# Patient Record
Sex: Female | Born: 1965 | Race: White | Hispanic: No | Marital: Single | Smoking: Current every day smoker
Health system: Southern US, Community
[De-identification: ages and names within clinical notes are randomized; demographics above are authoritative.]

## PROBLEM LIST (undated history)

## (undated) DIAGNOSIS — I10 Essential (primary) hypertension: Secondary | ICD-10-CM

## (undated) DIAGNOSIS — E7800 Pure hypercholesterolemia, unspecified: Secondary | ICD-10-CM

## (undated) DIAGNOSIS — E119 Type 2 diabetes mellitus without complications: Secondary | ICD-10-CM

## (undated) HISTORY — DX: Type 2 diabetes mellitus without complications: E11.9

## (undated) HISTORY — DX: Pure hypercholesterolemia, unspecified: E78.00

## (undated) HISTORY — PX: ENDOMETRIAL ABLATION: SHX621

## (undated) HISTORY — DX: Essential (primary) hypertension: I10

## (undated) HISTORY — PX: GANGLION CYST EXCISION: SHX1691

## (undated) HISTORY — PX: FOOT SURGERY: SHX648

---

## 2015-08-16 ENCOUNTER — Telehealth: Payer: Self-pay | Admitting: *Deleted

## 2015-08-16 ENCOUNTER — Ambulatory Visit (INDEPENDENT_AMBULATORY_CARE_PROVIDER_SITE_OTHER): Payer: No Typology Code available for payment source | Admitting: Family Medicine

## 2015-08-16 ENCOUNTER — Encounter: Payer: Self-pay | Admitting: Family Medicine

## 2015-08-16 VITALS — BP 121/58 | HR 88 | Temp 98.9°F | Resp 16 | Ht 69.5 in | Wt 156.0 lb

## 2015-08-16 DIAGNOSIS — R7303 Prediabetes: Secondary | ICD-10-CM

## 2015-08-16 DIAGNOSIS — Z1239 Encounter for other screening for malignant neoplasm of breast: Secondary | ICD-10-CM

## 2015-08-16 DIAGNOSIS — I10 Essential (primary) hypertension: Secondary | ICD-10-CM | POA: Insufficient documentation

## 2015-08-16 DIAGNOSIS — K029 Dental caries, unspecified: Secondary | ICD-10-CM

## 2015-08-16 LAB — POCT URINALYSIS DIP (DEVICE)
Bilirubin Urine: NEGATIVE
Glucose, UA: NEGATIVE mg/dL
HGB URINE DIPSTICK: NEGATIVE
Ketones, ur: NEGATIVE mg/dL
Leukocytes, UA: NEGATIVE
NITRITE: NEGATIVE
PH: 5.5 (ref 5.0–8.0)
PROTEIN: NEGATIVE mg/dL
Specific Gravity, Urine: 1.025 (ref 1.005–1.030)
UROBILINOGEN UA: 1 mg/dL (ref 0.0–1.0)

## 2015-08-16 LAB — HEMOGLOBIN A1C
HEMOGLOBIN A1C: 6.9 % — AB (ref <5.7)
MEAN PLASMA GLUCOSE: 151 mg/dL

## 2015-08-16 LAB — BASIC METABOLIC PANEL WITH GFR
BUN: 16 mg/dL (ref 7–25)
CO2: 23 mmol/L (ref 20–31)
CREATININE: 0.85 mg/dL (ref 0.50–1.05)
Calcium: 9.2 mg/dL (ref 8.6–10.4)
Chloride: 102 mmol/L (ref 98–110)
GFR, Est Non African American: 80 mL/min (ref >=60)
Glucose, Bld: 125 mg/dL — ABNORMAL HIGH (ref 65–99)
Potassium: 3.9 mmol/L (ref 3.5–5.3)
SODIUM: 136 mmol/L (ref 135–146)

## 2015-08-16 MED ORDER — LISINOPRIL 10 MG PO TABS: 10.0000 mg | ORAL_TABLET | Freq: Every day | ORAL | Status: DC

## 2015-08-16 NOTE — Patient Instructions (Signed)
Sent a referral to dentist Will call with laboratory results on 08/17/2015  Will send Lis[facility] 10 [facility] Sent an [facility] for [facility]   Fat and Cholesterol Restricted Diet High levels of fat and cholesterol in your blood may lead to various health problems, such as diseases of the heart, blood vessels, gallbladder, liver, and pancreas. Fats are concentrated sources of energy that come in various forms. Certain types of fat, including saturated fat, may be harmful in excess. Cholesterol is a substance needed by your body in small amounts. Your body makes all the cholesterol it needs. Excess cholesterol comes from the food you eat. When you have high levels of cholesterol and saturated fat in your blood, health problems can develop because the excess fat and cholesterol will gather along the walls of your blood vessels, causing them to narrow. Choosing the right foods will help you control your intake of fat and cholesterol. This will help keep the levels of these substances in your blood within normal limits and reduce your risk of disease. WHAT IS MY PLAN? Your health care provider recommends that you:  Get no more than __________ % of the total calories in your daily diet from fat.  Limit your intake of saturated fat to less than ______% of your total calories each day.  Limit the amount of cholesterol in your diet to less than _________mg per day. WHAT TYPES OF FAT SHOULD I CHOOSE?  Choose healthy fats more often. Choose monounsaturated and polyunsaturated fats, such as olive and canola oil, flaxseeds, walnuts, almonds, and seeds.  Eat more omega-3 fats. Good choices include salmon, mackerel, sardines, tuna, flaxseed oil, and ground flaxseeds. Aim to eat fish at least two times a week.  Limit saturated fats. Saturated fats are primarily found in animal products, such as meats, butter, and cream. Plant sources of saturated fats include palm oil,  palm kernel oil, and coconut oil.  Avoid foods with partially hydrogenated oils in them. These contain trans fats. Examples of foods that contain trans fats are stick margarine, some tub margarines, cookies, crackers, and other baked goods. WHAT GENERAL GUIDELINES DO I NEED TO FOLLOW? These guidelines for healthy eating will help you control your intake of fat and cholesterol:  Check food labels carefully to identify foods with trans fats or high amounts of saturated fat.  Fill one half of your plate with vegetables and green salads.  Fill one fourth of your plate with whole grains. Look for the word "whole" as the first word in the ingredient list.  Fill one fourth of your plate with lean protein foods.  Limit fruit to two servings a day. Choose fruit instead of juice.  Eat more foods that contain soluble fiber. Examples of foods that contain this type of fiber are apples, broccoli, carrots, beans, peas, and barley. Aim to get 20-30 g of fiber per day.  Eat more home-cooked food and less restaurant, buffet, and fast food.  Limit or avoid alcohol.  Limit foods high in starch and sugar.  Limit fried foods.  Cook foods using methods other than frying. Baking, boiling, grilling, and broiling are all great options.  Lose weight if you are overweight. Losing just 5-10% of your initial body weight can help your overall health and prevent diseases such as diabetes and heart disease. WHAT FOODS CAN I EAT? Grains Whole grains, such as whole wheat or whole grain breads, crackers, cereals, and pasta. Unsweetened oatmeal, bulgur, barley, quinoa,  or brown rice. Corn or whole wheat flour tortillas. Vegetables Fresh or frozen vegetables (raw, steamed, roasted, or grilled). Green salads. Fruits All fresh, canned (in natural juice), or frozen fruits. Meat and Other Protein Products Ground beef (85% or leaner), grass-fed beef, or beef trimmed of fat. Skinless chicken or [place]. Ground chicken or  [place]. Pork trimmed of fat. All fish and seafood. Eggs. Dried beans, peas, or lentils. Unsalted nuts or seeds. Unsalted canned or dry beans. Dairy Low-fat dairy products, such as skim or 1% milk, 2% or reduced-fat cheeses, low-fat ricotta or cottage cheese, or plain low-fat yogurt. Fats and Oils Tub margarines without trans fats. Light or reduced-fat mayonnaise and salad dressings. Avocado. Olive, canola, sesame, or safflower oils. Natural peanut or almond butter (choose ones without added sugar and oil). The items listed above may not be a complete list of recommended foods or beverages. Contact your dietitian for more options. WHAT FOODS ARE NOT RECOMMENDED? Grains White bread. White pasta. White rice. Cornbread. Bagels, pastries, and croissants. Crackers that contain trans fat. Vegetables White potatoes. Corn. Creamed or fried vegetables. Vegetables in a cheese sauce. Fruits Dried fruits. Canned fruit in light or heavy syrup. Fruit juice. Meat and Other Protein Products Fatty cuts of meat. Ribs, chicken wings, bacon, sausage, bologna, salami, chitterlings, fatback, hot dogs, bratwurst, and packaged luncheon meats. Liver and organ meats. Dairy Whole or 2% milk, cream, half-and-half, and cream cheese. Whole milk cheeses. Whole-fat or sweetened yogurt. Full-fat cheeses. Nondairy creamers and whipped toppings. Processed cheese, cheese spreads, or cheese curds. Sweets and Desserts Corn syrup, sugars, honey, and molasses. Candy. Jam and jelly. Syrup. Sweetened cereals. Cookies, pies, cakes, donuts, muffins, and ice cream. Fats and Oils Butter, stick margarine, lard, shortening, ghee, or bacon fat. Coconut, palm kernel, or palm oils. Beverages Alcohol. Sweetened drinks (such as sodas, lemonade, and fruit drinks or punches). The items listed above may not be a complete list of foods and beverages to avoid. Contact your dietitian for more information.   This information is not intended to replace  advice given to you by your health care provider. Make sure you discuss any questions you have with your health care provider.   Document Released: 03/05/2005 Document Revised: 03/26/2014 Document Reviewed: 06/03/2013 Elsevier Interactive Patient Education [employer]

## 2015-08-16 NOTE — Telephone Encounter (Signed)
Patient verified DOB Patient is aware [facility]. Patient is also aware of staff working on obtaining daughters DEPO injection. Daughter will call in the morning to verify the availability of the DEPO. No further questions at this time.

## 2015-08-16 NOTE — Progress Notes (Signed)
Subjective:    Patient ID: Barbara SoKaren Lloyd, female    DOB: [birth year], 50 y.o.   MRN: [id number]  HPI [name], a [age] year female with a history of hypertension and prediabetes presents to establish care. She was a patient of Barbara [facility]Lisa Stephens, [facility], she has been lost to follow up due to insurance constraints.  She is not exercising and is not adherent to low salt diet.  She does not check blood pressures at home.  Patient denies chest pain, dyspnea, fatigue, irregular heart beat, orthopnea, palpitations, syncope and tachypnea.     Past Medical History  Diagnosis Date  . Hypertension    Allergies  Allergen Reactions  . Ciprofloxacin Rash   Social History   Social History  . Marital Status: Single    Spouse Name: N/A  . Number of Children: N/A  . Years of Education: N/A   Occupational History  . Not on file.   Social History Main Topics  . Smoking status: Current Every Day Smoker -- 0.50 packs/day    Types: Cigarettes  . Smokeless tobacco: Never Used  . Alcohol Use: Yes     Comment: occ  . Drug Use: No  . Sexual Activity: Not on file   Other Topics Concern  . Not on file   Social History Narrative  . No narrative on file   Immunization History  Administered Date(s) Administered  . Pneumococcal Polysaccharide-23 08/15/2013  . Tdap 08/16/2011   Review of Systems  Constitutional: Negative.   HENT: Positive for dental problem.   Eyes: Negative.   Respiratory: Negative.   Cardiovascular: Negative.   Gastrointestinal: Negative.   Endocrine: Negative.   Genitourinary: Negative.   Musculoskeletal: Negative.   Skin: Negative.   Allergic/Immunologic: Negative.   Neurological: Negative.   Hematological: Negative.   Psychiatric/Behavioral: Negative.        Objective:   Physical Exam  Constitutional: She is oriented to person, place, and time. She appears well-developed and well-nourished.  HENT:  Head: Normocephalic and  atraumatic.  Right Ear: External ear normal.  Left Ear: External ear normal.  Nose: Nose normal.  Mouth/Throat: Oropharynx is clear and moist. Abnormal dentition. Dental caries present.  Eyes: Conjunctivae and EOM are normal. Pupils are equal, round, and reactive to light.  Neck: Normal range of motion. Neck supple.  Cardiovascular: Normal rate, regular rhythm, normal heart sounds and intact distal pulses.   Pulmonary/Chest: Effort normal and breath sounds normal.  Abdominal: Soft. Bowel sounds are normal.  Musculoskeletal: Normal range of motion.  Neurological: She is alert and oriented to person, place, and time. She has normal reflexes.  Skin: Skin is warm and dry.  Psychiatric: She has a normal mood and affect. Her behavior is normal. Judgment and thought content normal.      BP 121/58 mmHg  Pulse 88  Temp(Src) 98.9 F (37.2 C) (Oral)  Resp 16  Ht 5' 9.5" (1.765 m)  Wt 156 lb (70.761 kg)  BMI 22.71 kg/m2  SpO2 100% Assessment & Plan:   1. Essential hypertension The patient is asked to make an attempt to improve diet and exercise patterns to aid in medical management of this problem. - BASIC METABOLIC PANEL WITH GFR - Urinalysis Dipstick - lisinopril (PRINIVIL,ZESTRIL) 10 MG tablet; Take 1 tablet (10 mg total) by mouth daily.  Dispense: 30 tablet; Refill: 5  2. Dental caries  - Ambulatory referral to Dentistry  3. Prediabetes  - Hemoglobin A1c  4. Breast cancer screening  -  MM Digital Screening; Future    Routine Health Maintenance:  Recommend a lowfat, low carbohydrate diet divided over 5-6 small meals, increase water intake to 6-8 glasses, and 150 minutes per week of cardiovascular exercise.   Will check immunization registry  Pap smear in 3 months (Last pap smear 5 years)    Barbara Maroon, FNP

## 2015-08-17 ENCOUNTER — Other Ambulatory Visit: Payer: Self-pay | Admitting: Family Medicine

## 2015-08-17 DIAGNOSIS — E119 Type 2 diabetes mellitus without complications: Secondary | ICD-10-CM

## 2015-08-17 MED ORDER — METFORMIN HCL 500 MG PO TABS: 500.0000 mg | ORAL_TABLET | Freq: Every day | ORAL | Status: DC

## 2015-08-26 MED FILL — ?LISINOPRIL 10 MG TABLET: 10 | 30 days supply | Qty: 30 | Fill #0

## 2015-08-26 MED FILL — ?METFORMIN HCL 500MG TABLET: 500 | 30 days supply | Qty: 30 | Fill #0

## 2015-09-28 MED FILL — ?LISINOPRIL 10 MG TABLET: 10 | 30 days supply | Qty: 30 | Fill #1

## 2015-10-03 ENCOUNTER — Ambulatory Visit
Admission: RE | Admit: 2015-10-03 | Discharge: 2015-10-03 | Disposition: A | Discharge status: Home / Self Care | Payer: No Typology Code available for payment source | Source: Ambulatory Visit | Attending: Family Medicine | Admitting: Family Medicine

## 2015-10-03 DIAGNOSIS — Z1239 Encounter for other screening for malignant neoplasm of breast: Secondary | ICD-10-CM

## 2015-10-06 ENCOUNTER — Telehealth: Payer: Self-pay | Admitting: Family Medicine

## 2015-10-06 NOTE — Telephone Encounter (Signed)
Pt needs a referral for the eye doctor and for her sleep apnea. Please advise

## 2015-10-06 NOTE — Telephone Encounter (Signed)
Barbara Lloyd, Patient was only seen by [facility] once. She has orange card. Can these referrals be placed for her, or does she need an appointment? Please advise. Thanks!

## 2015-10-31 MED FILL — ?LISINOPRIL 10 MG TABLET: 10 | 30 days supply | Qty: 30 | Fill #2

## 2015-11-25 ENCOUNTER — Other Ambulatory Visit (HOSPITAL_COMMUNITY)
Admission: RE | Admit: 2015-11-25 | Discharge: 2015-11-25 | Disposition: A | Discharge status: Home / Self Care | Payer: Medicaid Other | Source: Ambulatory Visit | Attending: Family Medicine | Admitting: Family Medicine

## 2015-11-25 ENCOUNTER — Ambulatory Visit (INDEPENDENT_AMBULATORY_CARE_PROVIDER_SITE_OTHER): Payer: No Typology Code available for payment source | Admitting: Family Medicine

## 2015-11-25 VITALS — BP 137/70 | HR 78 | Temp 98.5°F | Resp 16 | Ht 69.5 in | Wt 158.0 lb

## 2015-11-25 DIAGNOSIS — Z113 Encounter for screening for infections with a predominantly sexual mode of transmission: Secondary | ICD-10-CM | POA: Insufficient documentation

## 2015-11-25 DIAGNOSIS — Z1151 Encounter for screening for human papillomavirus (HPV): Secondary | ICD-10-CM | POA: Insufficient documentation

## 2015-11-25 DIAGNOSIS — Z01419 Encounter for gynecological examination (general) (routine) without abnormal findings: Secondary | ICD-10-CM | POA: Insufficient documentation

## 2015-11-25 DIAGNOSIS — R0683 Snoring: Secondary | ICD-10-CM

## 2015-11-25 DIAGNOSIS — I10 Essential (primary) hypertension: Secondary | ICD-10-CM

## 2015-11-25 DIAGNOSIS — L409 Psoriasis, unspecified: Secondary | ICD-10-CM

## 2015-11-25 DIAGNOSIS — L0293 Carbuncle, unspecified: Secondary | ICD-10-CM

## 2015-11-25 DIAGNOSIS — Z0000 Encounter for general adult medical examination without abnormal findings: Secondary | ICD-10-CM

## 2015-11-25 DIAGNOSIS — N760 Acute vaginitis: Secondary | ICD-10-CM | POA: Diagnosis present

## 2015-11-25 DIAGNOSIS — H9190 Unspecified hearing loss, unspecified ear: Secondary | ICD-10-CM

## 2015-11-25 MED ORDER — LISINOPRIL 10 MG PO TABS
10.0000 mg | ORAL_TABLET | Freq: Every day | ORAL | 1 refills | Status: DC
Start: 2015-11-25 — End: 2016-03-06

## 2015-11-25 MED ORDER — FLUOCINONIDE-E 0.05 % EX CREA: 1.0000 "application " | TOPICAL_CREAM | Freq: Two times a day (BID) | CUTANEOUS | 1 refills | Status: DC

## 2015-11-25 NOTE — Patient Instructions (Signed)
Have sent BP and cream to pharmacy. Will send antibiotic later today.

## 2015-11-28 MED FILL — FLUOCINONIDE-E 0.05% CREAM: 0.05 | 20 days supply | Qty: 30 | Fill #0

## 2015-11-28 MED FILL — ?LISINOPRIL 10 MG TABLET: 10 | 30 days supply | Qty: 30 | Fill #0

## 2015-11-28 NOTE — Progress Notes (Signed)
Barbara Lloyd, is a 50 y.o. female  ZOX:[id number]  [id number]  DOB - 1965/10/25  CC:  Chief Complaint  Patient presents with  . Gynecologic Exam  . Hearing Problem  . Psoriasis    wants refrerral to dermatology   . Snoring    wants sleep referral   . Medication Refill    bp med refilled . wants a cream for blotchy skin. boil in groin area.        HPI: Barbara Lloyd is a 50 y.o. female here to establish care.She has recently received orange card and is asking for several referrals. She has psoriasis and wants referral to dermatology. She snores and wishes referral for sleep study. She has hearing problems and wants referral to audiology. She has a history of hypertension and prediabetes. She is on lisinopril 10 mg for hypertension. She has been prescribed metformin but is not taking. She request a refill of flucinonide for psorasis.  Allergies  Allergen Reactions  . Ciprofloxacin Rash  . Sulfa Antibiotics Rash   Past Medical History:  Diagnosis Date  . Hypertension    Current Outpatient Prescriptions on File Prior to Visit  Medication Sig Dispense Refill  . metFORMIN (GLUCOPHAGE) 500 MG tablet Take 1 tablet (500 mg total) by mouth daily with breakfast. (Patient not taking: Reported on 11/25/2015) 30 tablet 2   No current facility-administered medications on file prior to visit.    Family History  Problem Relation Age of Onset  . Adopted: Yes   Social History   Social History  . Marital status: Single    Spouse name: N/A  . Number of children: N/A  . Years of education: N/A   Occupational History  . Not on file.   Social History Main Topics  . Smoking status: Current Every Day Smoker    Packs/day: 0.50    Types: Cigarettes  . Smokeless tobacco: Never Used  . Alcohol use Yes     Comment: occ  . Drug use: No  . Sexual activity: Not on file   Other Topics Concern  . Not on file   Social History Narrative  . No narrative on file    Review of  Systems: Constitutional: Negative Skin: Positive for psorasis and recurrent boils HENT: Positive for dental caries Eyes: Negative  Neck: Negative Respiratory: Negative Cardiovascular: Negative Gastrointestinal: Negative Genitourinary: Negative  Musculoskeletal: Negative   Neurological: Negative for Hematological: Negative  Psychiatric/Behavioral: Negative    Objective:   Vitals:   11/25/15 1034  BP: 137/70  Pulse: 78  Resp: 16  Temp: 98.5 F (36.9 C)    Physical Exam: Constitutional: Patient appears well-developed and well-nourished. No distress. HENT: Normocephalic, atraumatic, External right and left ear normal. Oropharynx is clear and moist.  Eyes: Conjunctivae and EOM are normal. PERRLA, no scleral icterus. Neck: Normal ROM. Neck supple. No lymphadenopathy, No thyromegaly. CVS: RRR, S1/S2 +, no murmurs, no gallops, no rubs Pulmonary: Effort and breath sounds normal, no stridor, rhonchi, wheezes, rales.  Abdominal: Soft. Normoactive BS,, no distension, tenderness, rebound or guarding.  Musculoskeletal: Normal range of motion. No edema and no tenderness.  Neuro: Alert.Normal muscle tone coordination. Non-focal Skin: Skin is warm and dry. No rash noted. Not diaphoretic. No erythema. No pallor. Psychiatric: Normal mood and affect. Behavior, judgment, thought content normal.  No results found for: WBC, HGB, HCT, MCV, PLT Lab Results  Component Value Date   CREATININE 0.85 08/16/2015   BUN 16 08/16/2015   NA 136 08/16/2015   K  3.9 08/16/2015   CL 102 08/16/2015   CO2 23 08/16/2015    Lab Results  Component Value Date   HGBA1C 6.9 (H) 08/16/2015   Lipid Panel  No results found for: CHOL, TRIG, HDL, CHOLHDL, VLDL, LDLCALC     Assessment and plan:   1. Essential hypertension  - lisinopril (PRINIVIL,ZESTRIL) 10 MG tablet; Take 1 tablet (10 mg total) by mouth daily.  Dispense: 90 tablet; Refill: 1  2. Well woman exam  - Cytology - PAP [place]um  3.  Psoriasis -Lidex prescribed   4. Recurrent boils -Tetracycline prescribed.   5. Snoring  - Nocturnal polysomnography; Future  6. Decreased hearing, unspecified laterality  - Ambulatory referral to Audiology   No Follow-up on file.  The patient was given clear instructions to go to ER or return to medical center if symptoms don't improve, worsen or new problems develop. The patient verbalized understanding.    Henrietta HooverLinda C Berdine Rasmusson FNP  11/28/2015, 8:09 AM

## 2015-11-29 ENCOUNTER — Other Ambulatory Visit: Payer: Self-pay | Admitting: Family Medicine

## 2015-11-29 LAB — CYTOLOGY - PAP

## 2015-11-29 MED ORDER — TETRACYCLINE HCL 250 MG PO CAPS: 250.0000 mg | ORAL_CAPSULE | Freq: Four times a day (QID) | ORAL | Status: DC

## 2015-11-30 ENCOUNTER — Other Ambulatory Visit: Payer: Self-pay | Admitting: Family Medicine

## 2015-11-30 LAB — CERVICOVAGINAL ANCILLARY ONLY: Candida vaginitis: NEGATIVE

## 2015-11-30 MED ORDER — METRONIDAZOLE 500 MG PO TABS: 500.0000 mg | ORAL_TABLET | Freq: Two times a day (BID) | ORAL | 0 refills | Status: DC

## 2015-11-30 MED ORDER — TETRACYCLINE HCL 250 MG PO CAPS: 250.0000 mg | ORAL_CAPSULE | Freq: Four times a day (QID) | ORAL | 0 refills | Status: DC

## 2015-11-30 MED FILL — TETRACYCLINE 250 MG CAPSULE: 250 | 10 days supply | Qty: 40 | Fill #0

## 2015-11-30 MED FILL — metroNIDAZOLE 500 MG TABS: 500 | 7 days supply | Qty: 14 | Fill #0

## 2015-11-30 NOTE — Progress Notes (Signed)
Patient returned call and I advised of normal pap and presence of bv and to take flagyl as prescribed until finnished and to avoid alcohol.

## 2015-12-28 MED FILL — LISINOPRIL 10 MG TABLET: 10 | 30 days supply | Qty: 30 | Fill #1

## 2016-01-03 ENCOUNTER — Ambulatory Visit: Payer: No Typology Code available for payment source | Attending: Family Medicine | Admitting: Audiology

## 2016-01-03 DIAGNOSIS — H9193 Unspecified hearing loss, bilateral: Secondary | ICD-10-CM

## 2016-01-03 DIAGNOSIS — R292 Abnormal reflex: Secondary | ICD-10-CM | POA: Insufficient documentation

## 2016-01-03 DIAGNOSIS — H93299 Other abnormal auditory perceptions, unspecified ear: Secondary | ICD-10-CM

## 2016-01-03 DIAGNOSIS — H9011 Conductive hearing loss, unilateral, right ear, with unrestricted hearing on the contralateral side: Secondary | ICD-10-CM

## 2016-01-03 DIAGNOSIS — H748X9 Other specified disorders of middle ear and mastoid, unspecified ear: Secondary | ICD-10-CM

## 2016-01-03 NOTE — Procedures (Signed)
Outpatient Audiology and [facility]  [address]  [place], [place] [address]  [phone]   Audiological Evaluation  Patient Name: Barbara Lloyd   Status: Outpatient   DOB: [birth year]    Diagnosis: Hearing Loss                MRN: [id number] Date:  01/03/2016     Referent: Concepcion LivingBERNHARDT, LINDA, NP  History: Barbara Lloyd was seen for an audiological evaluation. Accompanied by: Barbara Lloyd Primary Concern: "Struggle hearing at times with high pitched tinnitus".  Sometimes has difficulty hearing on the telephone. Hearing poorer on the right side. History of hearing problems:  N History of ear infections:   N History of ear surgery or "tubes" : N History of dizziness/vertigo:   N History of balance issues:  N Tinnitus: Y  High pitched Sound sensitivity: N History of hypertension: N History of diabetes:  N Medications: Lisinopril 10mg     Evaluation: Conventional pure tone audiometry from 250Hz  - 8000Hz  with using insert earphones.  Hearing Thresholds: Right ear:  Thresholds of *40 dBHL at 250Hz  and 15-30 dBHL from 500Hz  - 8000Hz . Conductive component.  Left ear:    Thresholds of 30 dBHL at 250Hz ; 10-20 dBHL from 500Hz  - 2000Hz  and 25-35 dBHL from 4000Hz  - 8000Hz . Mixed component.  Reliability is good Speech reception levels (repeating words near threshold) using recorded spondee word lists:  Right ear: 20 dBHL.  Left ear:  20 dBHL Word recognition (at comfortably loud volumes) using recorded NU-6 word lists at 70 dBHL, in quiet.  Right ear: 96%.  Left ear:   100% Word recognition in minimal background noise:  +5 dBHL  Right ear: 64%                               Left ear:  82%  Tympanometry (middle ear function) shows normal middle ear volume, pressure and compliance bilaterally (Type A). Ipsilateral acoustic reflexes 90 dB from 500Hz  - 2000Hz  and 95-100dB at 4000Hz  bilaterally.    Acoustic reflex decay is negative on the left side and negative on the right  side using 1000Hz  tone.    CONCLUSION:      Barbara Lloyd needs close monitoring of her hearing, especially on the right side.  A repeat hearing evaluation has been scheduled here in early November here, however, please cancel this appointment if Concepcion LivingBERNHARDT, LINDA, NP refers Barbara Lloyd to an ENT.  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  The left ear has better hearing thresholds, ranging from normal to a borderline mild hearing loss that appears to have a sensorineural component.   This amount of hearing loss would adversely affect speech communication at normal conversational speech levels.   Word recognition is excellent in each ear in quiet at conversational speech levels bilaterally. In minimal background noise, word recognition drops to poor in the right ear and is remains good in the left ear.      There is no sign of retrocochlear issues in either ear, with the slightly elevated acoustic reflexes at 4000Hz  possible with the high frequency hearing loss.   Please have BERNHARDT, LINDA, NP evaluate the conductive component on the right side to rule out sinus/allergies contributing to this and repeat the hearing test.  Barbara Lloyd state that she has "flonase at home that  she will start now until the retest here".    RECOMMENDATIONS: 1.   Monitor hearing closely with a repeat audiological evaluation on January 19, 2016 at [facility].  Please cancel this appointment if referrered to an ENT. 2.   Consider further evaluation of the tinnitus by an Ear, Nose and Throat physician for the right ear conductive component and reported tinnitus.   3. To minimize the adverse effects of tinnitus 1) avoid quiet  2) use noise maskers at home such as a sound machine, quiet music, a fan or other background noise at a volume just loud enough to mask the high pitched  tinnitus. 3) If the tinnitus becomes more bothersome, adversely affecting your sleep or concentration, contact your physician,  seek additional medical help by an ENT for further treatment of your tinnitus. 4.  Strategies that help improve hearing include: A) Face the speaker directly. Optimal is having the speakers face well - lit.  Unless amplified, being within 3-6 feet of the speaker will enhance word recognition. B) Avoid having the speaker back-lit as this will minimize the ability to use cues from lip-reading, facial expression and gestures. C)  Word recognition is poorer in background noise. For optimal word recognition, turn off the TV, radio or noisy fan when engaging in conversation. In a restaurant, try to sit away from noise sources and close to the primary speaker.  D)  Ask for topic clarification from time to time in order to remain in the conversation.  Most people don't mind repeating or clarifying a point when asked.  If needed, explain the difficulty hearing in background noise or hearing loss. 5.   Use hearing protection during noisy activities such as using a weed eater, moving the lawn, shooting, etc.    Musician's plugs, are available from [employer] for music related hearing protection because there is no distortion.  Other hearing protection, such as sponge plugs (available at pharmacies) or earmuffs (available at sporting goods stores or department stores such as [employer]) are useful for noisy activities and venues.  Kamelia Lampkins L. Kate Sable, Au.D., CCC-A Doctor of Audiology 01/03/2016

## 2016-01-04 ENCOUNTER — Ambulatory Visit: Payer: No Typology Code available for payment source | Admitting: Audiology

## 2016-01-10 ENCOUNTER — Telehealth: Payer: Self-pay

## 2016-01-11 ENCOUNTER — Other Ambulatory Visit: Payer: Self-pay | Admitting: Family Medicine

## 2016-01-12 ENCOUNTER — Other Ambulatory Visit: Payer: Self-pay | Admitting: Family Medicine

## 2016-01-12 DIAGNOSIS — H9190 Unspecified hearing loss, unspecified ear: Secondary | ICD-10-CM

## 2016-01-13 ENCOUNTER — Ambulatory Visit: Payer: No Typology Code available for payment source | Attending: Internal Medicine

## 2016-01-17 ENCOUNTER — Ambulatory Visit (HOSPITAL_BASED_OUTPATIENT_CLINIC_OR_DEPARTMENT_OTHER): Payer: Self-pay | Attending: Family Medicine | Admitting: Internal Medicine

## 2016-01-17 VITALS — Ht 69.5 in | Wt 160.0 lb

## 2016-01-17 DIAGNOSIS — R0683 Snoring: Secondary | ICD-10-CM | POA: Insufficient documentation

## 2016-01-19 ENCOUNTER — Ambulatory Visit: Payer: No Typology Code available for payment source | Admitting: Audiology

## 2016-01-20 LAB — GLUCOSE, POCT (MANUAL RESULT ENTRY): POC Glucose: 119 mg/dl — AB (ref 70–99)

## 2016-01-26 ENCOUNTER — Telehealth: Payer: Self-pay

## 2016-01-26 NOTE — Telephone Encounter (Signed)
LINDA, PLEASE ADVISE. THANKS!

## 2016-01-28 DIAGNOSIS — R0683 Snoring: Secondary | ICD-10-CM

## 2016-01-28 NOTE — Procedures (Signed)
  Patient Name: Barbara Lloyd, Sheriece Study Date: 01/17/2016 Gender: Female D.O.B: 11-08-1965 Age (years): 50 Referring Provider: Henrietta HooverLinda C Bernhardt Height (inches): 70 Interpreting Physician: Jetty Duhamellinton Waylin Dorko MD, ABSM Weight (lbs): 160 RPSGT: Wylie HailDavis, Rico BMI: 23 MRN: [id number] Neck Size: 14.25 CLINICAL INFORMATION Sleep Study Type: NPSG Indication for sleep study: Snoring Epworth Sleepiness Score: 12  SLEEP STUDY TECHNIQUE As per the AASM Manual for the Scoring of Sleep and Associated Events v2.3 (April 2016) with a hypopnea requiring 4% desaturations. The channels recorded and monitored were frontal, central and occipital EEG, electrooculogram (EOG), submentalis EMG (chin), nasal and oral airflow, thoracic and abdominal wall motion, anterior tibialis EMG, snore microphone, electrocardiogram, and pulse oximetry.  MEDICATIONS Medications self-administered by patient taken the night of the study : charted for review  SLEEP ARCHITECTURE The study was initiated at 10:26:41 PM and ended at 4:38:21 AM. Sleep onset time was 3.4 minutes and the sleep efficiency was 93.4%. The total sleep time was 347.2 minutes. Stage REM latency was 101.0 minutes. The patient spent 6.77% of the night in stage N1 sleep, 77.54% in stage N2 sleep, 2.02% in stage N3 and 13.68% in REM. Alpha intrusion was absent. Supine sleep was 2.15%.  RESPIRATORY PARAMETERS The overall apnea/hypopnea index (AHI) was 1.7 per hour. There were 2 total apneas, including 2 obstructive, 0 central and 0 mixed apneas. There were 8 hypopneas and 3 RERAs. The AHI during Stage REM sleep was 0.0 per hour. AHI while supine was 32.2 per hour. The mean oxygen saturation was 95.71%. The minimum SpO2 during sleep was 87.00%. Moderate snoring was noted during this study.  CARDIAC DATA The 2 lead EKG demonstrated sinus rhythm. The mean heart rate was 62.87 beats per minute. Other EKG findings include: None.  LEG MOVEMENT DATA The total  PLMS were 0 with a resulting PLMS index of 0.00. Associated arousal with leg movement index was 0.0 .  IMPRESSIONS - No significant obstructive sleep apnea occurred during this study (AHI = 1.7/h). - No significant central sleep apnea occurred during this study (CAI = 0.0/h). - Mild oxygen desaturation was noted during this study (Min O2 = 87.00%). - The patient snored with Moderate snoring volume. - No cardiac abnormalities were noted during this study. - Clinically significant periodic limb movements did not occur during sleep. No significant associated arousals.  DIAGNOSIS - Normal study  RECOMMENDATIONS - Positional therapy avoiding supine position during sleep. - Avoid alcohol, sedatives and other CNS depressants that may worsen sleep apnea and disrupt normal sleep architecture. - Sleep hygiene should be reviewed to assess factors that may improve sleep quality. - Weight management and regular exercise should be initiated or continued if appropriate.  [Electronically signed] 01/28/2016 11:56 AM  Jetty Duhamellinton Brentney Goldbach MD, ABSM Diplomate, American Board of Sleep Medicine   NPI: [id number]  Waymon BudgeYOUNG,Hadiyah Maricle D Diplomate, American Board of Sleep Medicine  ELECTRONICALLY SIGNED ON:  01/28/2016, 11:54 AM [facility] PH: [phone]   FX: [phone] ACCREDITED BY THE AMERICAN ACADEMY OF SLEEP MEDICINE

## 2016-01-31 MED FILL — ?LISINOPRIL 10 MG TABLET: 10 | 30 days supply | Qty: 30 | Fill #2

## 2016-02-07 ENCOUNTER — Ambulatory Visit: Payer: Medicaid Other | Attending: Internal Medicine

## 2016-02-07 ENCOUNTER — Telehealth: Payer: Self-pay

## 2016-02-08 NOTE — Telephone Encounter (Signed)
ERROR

## 2016-02-26 ENCOUNTER — Emergency Department (INDEPENDENT_AMBULATORY_CARE_PROVIDER_SITE_OTHER)
Admission: EM | Admit: 2016-02-26 | Discharge: 2016-02-26 | Disposition: A | Discharge status: Home / Self Care | Payer: No Typology Code available for payment source | Source: Home / Self Care | Attending: Family Medicine | Admitting: Family Medicine

## 2016-02-26 ENCOUNTER — Encounter: Payer: Self-pay | Admitting: Emergency Medicine

## 2016-02-26 DIAGNOSIS — J209 Acute bronchitis, unspecified: Secondary | ICD-10-CM

## 2016-02-26 DIAGNOSIS — R300 Dysuria: Secondary | ICD-10-CM

## 2016-02-26 LAB — POCT URINALYSIS DIP (MANUAL ENTRY)
Bilirubin, UA: NEGATIVE
Blood, UA: NEGATIVE
Glucose, UA: NEGATIVE
Ketones, POC UA: NEGATIVE
Leukocytes, UA: NEGATIVE
Nitrite, UA: NEGATIVE
Protein Ur, POC: NEGATIVE
Spec Grav, UA: 1.025
Urobilinogen, UA: 1
pH, UA: 5.5

## 2016-02-26 MED ORDER — IPRATROPIUM BROMIDE 0.06 % NA SOLN: 2.0000 | Freq: Four times a day (QID) | NASAL | 12 refills | Status: DC

## 2016-02-26 MED ORDER — AZITHROMYCIN 250 MG PO TABS: 250.0000 mg | ORAL_TABLET | Freq: Every day | ORAL | 0 refills | Status: DC

## 2016-02-26 MED ORDER — GUAIFENESIN-CODEINE 100-10 MG/5ML PO SOLN: 10.0000 mL | Freq: Four times a day (QID) | ORAL | 0 refills | Status: DC | PRN

## 2016-02-26 NOTE — ED Triage Notes (Signed)
Pt c/o productive cough x 1 week, when taking a deep breath it hurts across chest.  Also possible UTI, having pressure.

## 2016-02-26 NOTE — ED Provider Notes (Signed)
CSN: [id number]     Arrival date & time 02/26/16  1242 History   First MD Initiated Contact with Patient 02/26/16 1320     Chief Complaint  Patient presents with  . Cough  . Urinary Tract Infection   (Consider location/radiation/quality/duration/timing/severity/associated sxs/prior Treatment) HPI Barbara Lloyd is a 50 y.o. female presenting to UC with c/o 1 week of gradually worsening productive cough with green sputum.  Pt also reports mild chest pain with deep breathing and cough.  She has been using OTC Robitussin w/o relief.  Denies known fever but notes she has been taking Tylenol "around the clock."  Denies n/v/d. She is also c/o lower abdominal pressure with urinary frequency but denies pain or hematuria. Denies lower back pain.    Past Medical History:  Diagnosis Date  . Hypertension    Past Surgical History:  Procedure Laterality Date  . CESAREAN SECTION     x2  . ENDOMETRIAL ABLATION     2006  . FOOT SURGERY    . GANGLION CYST EXCISION     Family History  Problem Relation Age of Onset  . Adopted: Yes   Social History  Substance Use Topics  . Smoking status: Current Every Day Smoker    Packs/day: 0.50    Types: Cigarettes  . Smokeless tobacco: Never Used  . Alcohol use Yes     Comment: occ   OB History    No data available     Review of Systems  Constitutional: Negative for chills and fever.  HENT: Positive for congestion and sore throat ( mild). Negative for ear pain, trouble swallowing and voice change.   Respiratory: Positive for cough and chest tightness. Negative for shortness of breath.   Cardiovascular: Negative for chest pain and palpitations.  Gastrointestinal: Negative for abdominal pain, diarrhea, nausea and vomiting.  Musculoskeletal: Negative for arthralgias, back pain and myalgias.  Skin: Negative for rash.  Neurological: Negative for dizziness, light-headedness and headaches.    Allergies  Ciprofloxacin and Sulfa antibiotics  Home  Medications   Prior to Admission medications   Medication Sig Start Date End Date Taking? Authorizing Provider  azithromycin (ZITHROMAX) 250 MG tablet Take 1 tablet (250 mg total) by mouth daily. Take first 2 tablets together, then 1 every day until finished. 02/26/16   Barbara FinnerErin O'Malley, PA-C  guaiFENesin-codeine 100-10 MG/5ML syrup Take 10 mLs by mouth every 6 (six) hours as needed for cough. 02/26/16   Barbara FinnerErin O'Malley, PA-C  ipratropium (ATROVENT) 0.06 % nasal spray Place 2 sprays into both nostrils 4 (four) times daily. 02/26/16   Barbara FinnerErin O'Malley, PA-C  lisinopril (PRINIVIL,ZESTRIL) 10 MG tablet Take 1 tablet (10 mg total) by mouth daily. 11/25/15   Henrietta HooverLinda C Bernhardt, NP   Meds Ordered and Administered this Visit  Medications - No data to display  BP 113/72 (BP Location: Left Arm)   Pulse 81   Temp 98.5 F (36.9 C) (Oral)   Ht 5\' 9"  (1.753 m)   Wt 164 lb (74.4 kg)   SpO2 99%   BMI 24.22 kg/m  No data found.   Physical Exam  Constitutional: She appears well-developed and well-nourished. No distress.  HENT:  Head: Normocephalic and atraumatic.  Right Ear: Tympanic membrane normal.  Left Ear: Tympanic membrane normal.  Nose: Mucosal edema present. Right sinus exhibits no maxillary sinus tenderness and no frontal sinus tenderness. Left sinus exhibits no maxillary sinus tenderness and no frontal sinus tenderness.  Mouth/Throat: Uvula is midline and mucous membranes are normal. Posterior  oropharyngeal erythema present. No oropharyngeal exudate, posterior oropharyngeal edema or tonsillar abscesses.  Eyes: Conjunctivae are normal. No scleral icterus.  Neck: Normal range of motion. Neck supple.  Cardiovascular: Normal rate, regular rhythm and normal heart sounds.   Pulmonary/Chest: Effort normal. No stridor. No respiratory distress. She has wheezes (faint expiratory in lower lung fields.). She has rhonchi ( faint diffuse, clears with cough). She has no rales. She exhibits no tenderness.   Abdominal: Soft. She exhibits no distension. There is no tenderness. There is no CVA tenderness.  Musculoskeletal: Normal range of motion.  Lymphadenopathy:    She has no cervical adenopathy.  Neurological: She is alert.  Skin: Skin is warm and dry. She is not diaphoretic.  Nursing note and vitals reviewed.   Urgent Care Course   Clinical Course     Procedures (including critical care time)  Labs Review Labs Reviewed  POCT URINALYSIS DIP (MANUAL ENTRY)    Imaging Review No results found.   MDM   1. Acute bronchitis, unspecified organism    Pt c/o 1 week of worsening productive cough as well as urinary frequency with bladder pressure.   Faint diffuse wheeze and rhonchi, will cover for atypical bacteria for bronchitis.  Rx: Azithromycin and ipratropium nasal spray.  Guaifenesine-codeine for cough at night. F/u with PCP in 7-10 days if not improving.       Barbara Finnerrin O'Malley, PA-C 02/26/16 1340

## 2016-02-26 NOTE — Discharge Instructions (Signed)
°  Guaifenesine-Codeine cough medication can cause drowsiness. Do not drink alcohol or drive while taking.  This medication is good to take 30 minutes to 1 hour prior to bedtime to help prevent coughing at night.  For the Ipratropium nasal spray, be sure to use as prescribed to help prevent post-nasal drip, which can trigger coughing, especially at night.  Use 2 sprays per nostril 4 times daily while sick.  You should spray one time in each nostril pointing the spray to the outter portion of your nostril, breath in slowly while spraying. Wait about 30 seconds to 1 minute before given the second spray in each nostril.  This will ensure you get the most benefit from each spray.

## 2016-02-27 MED FILL — ?LISINOPRIL 10 MG TABLET: 10 | 30 days supply | Qty: 30 | Fill #3

## 2016-03-06 ENCOUNTER — Emergency Department (INDEPENDENT_AMBULATORY_CARE_PROVIDER_SITE_OTHER): Payer: No Typology Code available for payment source

## 2016-03-06 ENCOUNTER — Encounter: Payer: Self-pay | Admitting: *Deleted

## 2016-03-06 ENCOUNTER — Emergency Department (INDEPENDENT_AMBULATORY_CARE_PROVIDER_SITE_OTHER)
Admission: EM | Admit: 2016-03-06 | Discharge: 2016-03-06 | Disposition: A | Discharge status: Home / Self Care | Payer: No Typology Code available for payment source | Source: Home / Self Care | Attending: Family Medicine | Admitting: Family Medicine

## 2016-03-06 DIAGNOSIS — R0789 Other chest pain: Secondary | ICD-10-CM

## 2016-03-06 DIAGNOSIS — B9789 Other viral agents as the cause of diseases classified elsewhere: Secondary | ICD-10-CM

## 2016-03-06 DIAGNOSIS — R05 Cough: Secondary | ICD-10-CM

## 2016-03-06 DIAGNOSIS — I10 Essential (primary) hypertension: Secondary | ICD-10-CM

## 2016-03-06 DIAGNOSIS — J069 Acute upper respiratory infection, unspecified: Secondary | ICD-10-CM

## 2016-03-06 MED ORDER — LISINOPRIL 10 MG PO TABS: 10.0000 mg | ORAL_TABLET | Freq: Every day | ORAL | 1 refills | Status: DC

## 2016-03-06 MED ORDER — DOXYCYCLINE HYCLATE 100 MG PO CAPS: 100.0000 mg | ORAL_CAPSULE | Freq: Two times a day (BID) | ORAL | 0 refills | Status: DC

## 2016-03-06 MED ORDER — GUAIFENESIN-CODEINE 100-10 MG/5ML PO SOLN: ORAL | 0 refills | Status: DC

## 2016-03-06 NOTE — ED Triage Notes (Signed)
Pt reports that her cough got some better from her visit on 02/26/16 with Zpak, but then moved to nasal congestion and now she reports a productive cough again.

## 2016-03-06 NOTE — Discharge Instructions (Signed)
Take plain guaifenesin (1200mg extended release tabs such as Mucinex) twice daily, with plenty of water, for cough and congestion.   Get adequate rest.   °May use Afrin nasal spray (or generic oxymetazoline) twice daily for about 5 days and then discontinue.  Also recommend using saline nasal spray several times daily and saline nasal irrigation (AYR is a common brand).  Use Flonase nasal spray each morning after using Afrin nasal spray and saline nasal irrigation. °Try warm salt water gargles for sore throat.  °Stop all antihistamines for now, and other non-prescription cough/cold preparations. °Begin Doxycycline if not improving about one week or if persistent fever develops   °Follow-up with family doctor if not improving about10 days.  °

## 2016-03-06 NOTE — ED Provider Notes (Signed)
[facility]    CSN: [id number] Arrival date & time: 03/06/16  1853     History   Chief Complaint Chief Complaint  Patient presents with  . Cough    HPI Barbara Lloyd is a 50 y.o. female.   Patient had been treated for a URI on 02/26/16 with a Z-pak and improved.  She now has one week history of recurrent sore throat, sinus congestion, fatigue, and non-productive cough.  No fevers, chills, and sweats.  She has a past history of pneumonia about 4 years ago. She has a history of seasonal rhinitis. She requests refill of her blood pressure mediation (lisinopril); she notes no adverse effects from lisinopril.   The history is provided by the patient.    Past Medical History:  Diagnosis Date  . Hypertension     Patient Active Problem List   Diagnosis Date Noted  . Essential hypertension 08/16/2015  . Dental caries 08/16/2015  . Prediabetes 08/16/2015  . BP (high blood pressure) 08/16/2015    Past Surgical History:  Procedure Laterality Date  . CESAREAN SECTION     x2  . ENDOMETRIAL ABLATION     2006  . FOOT SURGERY    . GANGLION CYST EXCISION      OB History    No data available       Home Medications    Prior to Admission medications   Medication Sig Start Date End Date Taking? Authorizing Provider  doxycycline (VIBRAMYCIN) 100 MG capsule Take 1 capsule (100 mg total) by mouth 2 (two) times daily. Take with food (Rx void after 03/14/16) 03/06/16   Lattie HawStephen A Lakendria Nicastro, MD  guaiFENesin-codeine 100-10 MG/5ML syrup Take 10mL by mouth at bedtime as needed for cough.  May repeat in 4 to 6 hours. 03/06/16   Lattie HawStephen A Niurka Benecke, MD  ipratropium (ATROVENT) 0.06 % nasal spray Place 2 sprays into both nostrils 4 (four) times daily. 02/26/16   Junius FinnerErin O'Malley, PA-C  lisinopril (PRINIVIL,ZESTRIL) 10 MG tablet Take 1 tablet (10 mg total) by mouth daily. 03/06/16   Lattie HawStephen A Natahsa Marian, MD    Family History Family History  Problem Relation Age of Onset  . Adopted:  Yes    Social History Social History  Substance Use Topics  . Smoking status: Current Every Day Smoker    Packs/day: 0.50    Types: Cigarettes  . Smokeless tobacco: Never Used  . Alcohol use Yes     Comment: occ     Allergies   Ciprofloxacin and Sulfa antibiotics   Review of Systems Review of Systems  + sore throat + cough No pleuritic pain No wheezing + nasal congestion + post-nasal drainage No sinus pain/pressure No itchy/red eyes No earache No hemoptysis No SOB No fever, + chills No nausea No vomiting No abdominal pain No diarrhea No urinary symptoms No skin rash + fatigue No myalgias No headache Used OTC meds without relief    Physical Exam Triage Vital Signs ED Triage Vitals  Enc Vitals Group     BP 03/06/16 1916 147/87     Pulse Rate 03/06/16 1916 77     Resp 03/06/16 1916 16     Temp 03/06/16 1916 98.3 F (36.8 C)     Temp Source 03/06/16 1916 Oral     SpO2 03/06/16 1916 100 %     Weight 03/06/16 1917 162 lb (73.5 kg)     Height 03/06/16 1917 5\' 9"  (1.753 m)     Head Circumference --  Peak Flow --      Pain Score 03/06/16 1917 0     Pain Loc --      Pain Edu? --      Excl. in GC? --    No data found.   Updated Vital Signs BP 147/87 (BP Location: Left Arm)   Pulse 77   Temp 98.3 F (36.8 C) (Oral)   Resp 16   Ht 5\' 9"  (1.753 m)   Wt 162 lb (73.5 kg)   SpO2 100%   BMI 23.92 kg/m   Visual Acuity Right Eye Distance:   Left Eye Distance:   Bilateral Distance:    Right Eye Near:   Left Eye Near:    Bilateral Near:     Physical Exam Nursing notes and Vital Signs reviewed. Appearance:  Patient appears stated age, and in no acute distress Eyes:  Pupils are equal, round, and reactive to light and accomodation.  Extraocular movement is intact.  Conjunctivae are not inflamed  Ears:  Canals normal.  Tympanic membranes normal.  Nose:  Mildly congested turbinates.  No sinus tenderness.    Pharynx:  Normal Neck:  Supple.   Tender enlarged posterior/lateral nodes are palpated bilaterally  Lungs:  Clear to auscultation.  Breath sounds are equal.  Moving air well. Heart:  Regular rate and rhythm without murmurs, rubs, or gallops.  Abdomen:  Nontender without masses or hepatosplenomegaly.  Bowel sounds are present.  No CVA or flank tenderness.  Extremities:  No edema.  Skin:  No rash present.    UC Treatments / Results  Labs (all labs ordered are listed, but only abnormal results are displayed) Labs Reviewed - No data to display  EKG  EKG Interpretation None       Radiology Dg Chest 2 View  Result Date: 03/06/2016 CLINICAL DATA:  50 year old female with a history of chest tightness and cough EXAM: CHEST  2 VIEW COMPARISON:  None. FINDINGS: Cardiomediastinal silhouette within normal limits. No confluent airspace disease. No pneumothorax or pleural effusion. No displaced fracture. IMPRESSION: No radiographic evidence of acute cardiopulmonary disease. Signed, Yvone NeuJaime S. Loreta AveWagner, DO [facility] Electronically Signed   By: Gilmer MorJaime  Wagner D.O.   On: 03/06/2016 20:12    Procedures Procedures (including critical care time)  Medications Ordered in UC Medications - No data to display   Initial Impression / Assessment and Plan / UC Course  I have reviewed the triage vital signs and the nursing notes.  Pertinent labs & imaging results that were available during my care of the patient were reviewed by me and considered in my medical decision making (see chart for details).  Clinical Course   Suspect that patient has acquired a new viral URI after previous illness that responded to azithromycin. Rx for Robitussin AC for night time cough.  Take plain guaifenesin (1200mg  extended release tabs such as Mucinex) twice daily, with plenty of water, for cough and congestion.   Get adequate rest.   May use Afrin nasal spray (or generic oxymetazoline) twice daily for  about 5 days and then discontinue.  Also recommend using saline nasal spray several times daily and saline nasal irrigation (AYR is a common brand).  Use Flonase nasal spray each morning after using Afrin nasal spray and saline nasal irrigation. Try warm salt water gargles for sore throat.  Stop all antihistamines for now, and other non-prescription cough/cold preparations. Begin Doxycycline if not improving about one week or if persistent fever develops (Given  a prescription to hold, with an expiration date)  Follow-up with family doctor if not improving about10 days.   Will refill Rx for lisinopril (follow-up with PCP for management).     Final Clinical Impressions(s) / UC Diagnoses   Final diagnoses:  Viral URI with cough    New Prescriptions New Prescriptions   DOXYCYCLINE (VIBRAMYCIN) 100 MG CAPSULE    Take 1 capsule (100 mg total) by mouth 2 (two) times daily. Take with food (Rx void after 03/14/16)     Lattie Haw, MD 03/18/16 2216

## 2016-03-27 ENCOUNTER — Ambulatory Visit (INDEPENDENT_AMBULATORY_CARE_PROVIDER_SITE_OTHER): Payer: Self-pay | Admitting: Family Medicine

## 2016-03-27 ENCOUNTER — Encounter: Payer: Self-pay | Admitting: Family Medicine

## 2016-03-27 VITALS — BP 112/65 | HR 79 | Temp 98.2°F | Resp 16 | Ht 69.5 in | Wt 166.0 lb

## 2016-03-27 DIAGNOSIS — E119 Type 2 diabetes mellitus without complications: Secondary | ICD-10-CM | POA: Insufficient documentation

## 2016-03-27 DIAGNOSIS — Z1211 Encounter for screening for malignant neoplasm of colon: Secondary | ICD-10-CM

## 2016-03-27 DIAGNOSIS — I10 Essential (primary) hypertension: Secondary | ICD-10-CM

## 2016-03-27 DIAGNOSIS — R7303 Prediabetes: Secondary | ICD-10-CM

## 2016-03-27 DIAGNOSIS — Z114 Encounter for screening for human immunodeficiency virus [HIV]: Secondary | ICD-10-CM

## 2016-03-27 LAB — POCT GLYCOSYLATED HEMOGLOBIN (HGB A1C): Hemoglobin A1C: 7.1

## 2016-03-27 LAB — COMPLETE METABOLIC PANEL WITH GFR
ALT: 19 U/L (ref 6–29)
AST: 15 U/L (ref 10–35)
Albumin: 3.9 g/dL (ref 3.6–5.1)
Alkaline Phosphatase: 74 U/L (ref 33–130)
BUN: 17 mg/dL (ref 7–25)
CHLORIDE: 104 mmol/L (ref 98–110)
CO2: 25 mmol/L (ref 20–31)
Calcium: 9.2 mg/dL (ref 8.6–10.4)
Creat: 0.63 mg/dL (ref 0.50–1.05)
GFR, Est African American: >89 mL/min (ref >=60)
GFR, Est Non African American: >89 mL/min (ref >=60)
GLUCOSE: 121 mg/dL — AB (ref 65–99)
POTASSIUM: 4.3 mmol/L (ref 3.5–5.3)
SODIUM: 136 mmol/L (ref 135–146)
Total Bilirubin: 0.6 mg/dL (ref 0.2–1.2)
Total Protein: 7.2 g/dL (ref 6.1–8.1)

## 2016-03-27 LAB — CBC WITH DIFFERENTIAL/PLATELET
BASOS PCT: 1 %
Basophils Absolute: 70 cells/uL (ref 0–200)
EOS ABS: 140 {cells}/uL (ref 15–500)
Eosinophils Relative: 2 %
HCT: 39.7 % (ref 35.0–45.0)
Hemoglobin: 13.4 g/dL (ref 11.7–15.5)
LYMPHS PCT: 40 %
Lymphs Abs: 2800 cells/uL (ref 850–3900)
MCH: 29.8 pg (ref 27.0–33.0)
MCHC: 33.8 g/dL (ref 32.0–36.0)
MCV: 88.2 fL (ref 80.0–100.0)
MONOS PCT: 6 %
MPV: 10 fL (ref 7.5–12.5)
Monocytes Absolute: 420 cells/uL (ref 200–950)
NEUTROS PCT: 51 %
Neutro Abs: 3570 cells/uL (ref 1500–7800)
PLATELETS: 311 10*3/uL (ref 140–400)
RBC: 4.5 MIL/uL (ref 3.80–5.10)
RDW: 13.6 % (ref 11.0–15.0)
WBC: 7 10*3/uL (ref 3.8–10.8)

## 2016-03-27 LAB — POCT URINALYSIS DIP (DEVICE)
BILIRUBIN URINE: NEGATIVE
Glucose, UA: NEGATIVE mg/dL
Hgb urine dipstick: NEGATIVE
Ketones, ur: NEGATIVE mg/dL
NITRITE: NEGATIVE
PH: 6 (ref 5.0–8.0)
PROTEIN: NEGATIVE mg/dL
Specific Gravity, Urine: 1.025 (ref 1.005–1.030)
UROBILINOGEN UA: 0.2 mg/dL (ref 0.0–1.0)

## 2016-03-27 LAB — LIPID PANEL
CHOL/HDL RATIO: 4.8 ratio (ref <5.0)
Cholesterol: 192 mg/dL (ref <200)
HDL: 40 mg/dL — AB (ref >50)
LDL Cholesterol: 126 mg/dL — ABNORMAL HIGH (ref <100)
Triglycerides: 132 mg/dL (ref <150)
VLDL: 26 mg/dL (ref <30)

## 2016-03-27 MED ORDER — METFORMIN HCL 500 MG PO TABS: 500.0000 mg | ORAL_TABLET | Freq: Two times a day (BID) | ORAL | 1 refills | Status: DC

## 2016-03-27 MED ORDER — LISINOPRIL 10 MG PO TABS: 10.0000 mg | ORAL_TABLET | Freq: Every day | ORAL | 1 refills | Status: DC

## 2016-03-27 MED FILL — ?METFORMIN HCL 500MG TABLET: 500 | 30 days supply | Qty: 60 | Fill #0

## 2016-03-27 NOTE — Patient Instructions (Addendum)
Diabetes Mellitus and Exercise Exercising regularly is important for your overall health, especially when you have diabetes (diabetes mellitus). Exercising is not only about losing weight. It has many health benefits, such as increasing muscle strength and bone density and reducing body fat and stress. This leads to improved fitness, flexibility, and endurance, all of which result in better overall health. Exercise has additional benefits for people with diabetes, including:  Reducing appetite.  Helping to lower and control blood glucose.  Lowering blood pressure.  Helping to control amounts of fatty substances (lipids) in the blood, such as cholesterol and triglycerides.  Helping the body to respond better to insulin (improving insulin sensitivity).  Reducing how much insulin the body needs.  Decreasing the risk for heart disease by:  Lowering cholesterol and triglyceride levels.  Increasing the levels of good cholesterol.  Lowering blood glucose levels. What is my activity plan? Your health care provider or certified diabetes educator can help you make a plan for the type and frequency of exercise (activity plan) that works for you. Make sure that you:  Do at least 150 minutes of moderate-intensity or vigorous-intensity exercise each week. This could be brisk walking, biking, or water aerobics.  Do stretching and strength exercises, such as yoga or weightlifting, at least 2 times a week.  Spread out your activity over at least 3 days of the week.  Get some form of physical activity every day.  Do not go more than 2 days in a row without some kind of physical activity.  Avoid being inactive for more than 90 minutes at a time. Take frequent breaks to walk or stretch.  Choose a type of exercise or activity that you enjoy, and set realistic goals.  Start slowly, and gradually increase the intensity of your exercise over time. What do I need to know about managing my  diabetes?  Check your blood glucose before and after exercising.  If your blood glucose is higher than 240 mg/dL (13.3 mmol/L) before you exercise, check your urine for ketones. If you have ketones in your urine, do not exercise until your blood glucose returns to normal.  Know the symptoms of low blood glucose (hypoglycemia) and how to treat it. Your risk for hypoglycemia increases during and after exercise. Common symptoms of hypoglycemia can include:  Hunger.  Anxiety.  Sweating and feeling clammy.  Confusion.  Dizziness or feeling light-headed.  Increased heart rate or palpitations.  Blurry vision.  Tingling or numbness around the mouth, lips, or tongue.  Tremors or shakes.  Irritability.  Keep a rapid-acting carbohydrate snack available before, during, and after exercise to help prevent or treat hypoglycemia.  Avoid injecting insulin into areas of the body that are going to be exercised. For example, avoid injecting insulin into:  The arms, when playing tennis.  The legs, when jogging.  Keep records of your exercise habits. Doing this can help you and your health care provider adjust your diabetes management plan as needed. Write down:  Food that you eat before and after you exercise.  Blood glucose levels before and after you exercise.  The type and amount of exercise you have done.  When your insulin is expected to peak, if you use insulin. Avoid exercising at times when your insulin is peaking.  When you start a new exercise or activity, work with your health care provider to make sure the activity is safe for you, and to adjust your insulin, medicines, or food intake as needed.  Drink plenty   of water while you exercise to prevent dehydration or heat stroke. Drink enough fluid to keep your urine clear or pale yellow. This information is not intended to replace advice given to you by your health care provider. Make sure you discuss any questions you have with  your health care provider. Document Released: 05/26/2003 Document Revised: 09/23/2015 Document Reviewed: 08/15/2015 Elsevier Interactive Patient Education  2017 [place]. Diabetes Mellitus and Food It is important for you to manage your blood sugar (glucose) level. Your blood glucose level can be greatly affected by what you eat. Eating healthier foods in the appropriate amounts throughout the day at about the same time each day will help you control your blood glucose level. It can also help slow or prevent worsening of your diabetes mellitus. Healthy eating may even help you improve the level of your blood pressure and reach or maintain a healthy weight. General recommendations for healthful eating and cooking habits include:  Eating meals and snacks regularly. Avoid going long periods of time without eating to lose weight.  Eating a diet that consists mainly of plant-based foods, such as fruits, vegetables, nuts, legumes, and whole grains.  Using low-heat cooking methods, such as baking, instead of high-heat cooking methods, such as deep frying. Work with your dietitian to make sure you understand how to use the Nutrition Facts information on food labels. How can food affect me? Carbohydrates  Carbohydrates affect your blood glucose level more than any other type of food. Your dietitian will help you determine how many carbohydrates to eat at each meal and teach you how to count carbohydrates. Counting carbohydrates is important to keep your blood glucose at a healthy level, especially if you are using insulin or taking certain medicines for diabetes mellitus. Alcohol  Alcohol can cause sudden decreases in blood glucose (hypoglycemia), especially if you use insulin or take certain medicines for diabetes mellitus. Hypoglycemia can be a life-threatening condition. Symptoms of hypoglycemia (sleepiness, dizziness, and disorientation) are similar to symptoms of having too much alcohol. If your  health care provider has given you approval to drink alcohol, do so in moderation and use the following guidelines:  Women should not have more than one drink per day, and men should not have more than two drinks per day. One drink is equal to:  12 oz of beer.  5 oz of wine.  1 oz of hard liquor.  Do not drink on an empty stomach.  Keep yourself hydrated. Have water, diet soda, or unsweetened iced tea.  Regular soda, juice, and other mixers might contain a lot of carbohydrates and should be counted. What foods are not recommended? As you make food choices, it is important to remember that all foods are not the same. Some foods have fewer nutrients per serving than other foods, even though they might have the same number of calories or carbohydrates. It is difficult to get your body what it needs when you eat foods with fewer nutrients. Examples of foods that you should avoid that are high in calories and carbohydrates but low in nutrients include:  Trans fats (most processed foods list trans fats on the Nutrition Facts label).  Regular soda.  Juice.  Candy.  Sweets, such as cake, pie, doughnuts, and cookies.  Fried foods. What foods can I eat? Eat nutrient-rich foods, which will nourish your body and keep you healthy. The food you should eat also will depend on several factors, including:  The calories you need.  The medicines you  take.  Your weight.  Your blood glucose level.  Your blood pressure level.  Your cholesterol level. You should eat a variety of foods, including:  Protein.  Lean cuts of meat.  Proteins low in saturated fats, such as fish, egg whites, and beans. Avoid processed meats.  Fruits and vegetables.  Fruits and vegetables that may help control blood glucose levels, such as apples, mangoes, and yams.  Dairy products.  Choose fat-free or low-fat dairy products, such as milk, yogurt, and cheese.  Grains, bread, pasta, and rice.  Choose  whole grain products, such as multigrain bread, whole oats, and brown rice. These foods may help control blood pressure.  Fats.  Foods containing healthful fats, such as nuts, avocado, olive oil, canola oil, and fish. Does everyone with diabetes mellitus have the same meal plan? Because every person with diabetes mellitus is different, there is not one meal plan that works for everyone. It is very important that you meet with a dietitian who will help you create a meal plan that is just right for you. This information is not intended to replace advice given to you by your health care provider. Make sure you discuss any questions you have with your health care provider. Document Released: 11/30/2004 Document Revised: 08/11/2015 Document Reviewed: 01/30/2013 Elsevier Interactive Patient Education  2017 [employer]  Diabetes and Foot Care Diabetes may cause you to have problems because of poor blood supply (circulation) to your feet and legs. This may cause the skin on your feet to become thinner, break easier, and heal more slowly. Your skin may become dry, and the skin may peel and crack. You may also have nerve damage in your legs and feet causing decreased feeling in them. You may not notice minor injuries to your feet that could lead to infections or more serious problems. Taking care of your feet is one of the most important things you can do for yourself. Follow these instructions at home:  Wear shoes at all times, even in the house. Do not go barefoot. Bare feet are easily injured.  Check your feet daily for blisters, cuts, and redness. If you cannot see the bottom of your feet, use a mirror or ask someone for help.  Wash your feet with warm water (do not use hot water) and mild soap. Then pat your feet and the areas between your toes until they are completely dry. Do not soak your feet as this can dry your skin.  Apply a moisturizing lotion or petroleum jelly (that does not contain  alcohol and is unscented) to the skin on your feet and to dry, brittle toenails. Do not apply lotion between your toes.  Trim your toenails straight across. Do not dig under them or around the cuticle. File the edges of your nails with an emery board or nail file.  Do not cut corns or calluses or try to remove them with medicine.  Wear clean socks or stockings every day. Make sure they are not too tight. Do not wear knee-high stockings since they may decrease blood flow to your legs.  Wear shoes that fit properly and have enough cushioning. To break in new shoes, wear them for just a few hours a day. This prevents you from injuring your feet. Always look in your shoes before you put them on to be sure there are no objects inside.  Do not cross your legs. This may decrease the blood flow to your feet.  If you find a minor  scrape, cut, or break in the skin on your feet, keep it and the skin around it clean and dry. These areas may be cleansed with mild soap and water. Do not cleanse the area with peroxide, alcohol, or iodine.  When you remove an adhesive bandage, be sure not to damage the skin around it.  If you have a wound, look at it several times a day to make sure it is healing.  Do not use heating pads or hot water bottles. They may burn your skin. If you have lost feeling in your feet or legs, you may not know it is happening until it is too late.  Make sure your health care provider performs a complete foot exam at least annually or more often if you have foot problems. Report any cuts, sores, or bruises to your health care provider immediately. Contact a health care provider if:  You have an injury that is not healing.  You have cuts or breaks in the skin.  You have an ingrown nail.  You notice redness on your legs or feet.  You feel burning or tingling in your legs or feet.  You have pain or cramps in your legs and feet.  Your legs or feet are numb.  Your feet always feel  cold. Get help right away if:  There is increasing redness, swelling, or pain in or around a wound.  There is a red line that goes up your leg.  Pus is coming from a wound.  You develop a fever or as directed by your health care provider.  You notice a bad smell coming from an ulcer or wound. This information is not intended to replace advice given to you by your health care provider. Make sure you discuss any questions you have with your health care provider. Document Released: 03/02/2000 Document Revised: 08/11/2015 Document Reviewed: 08/12/2012 Elsevier Interactive Patient Education  2017 [employer]

## 2016-03-27 NOTE — Progress Notes (Signed)
Subjective:    Patient ID: Barbara Lloyd, female    DOB: Aug 12, 1965, 51 y.o.   MRN: [id number]  HPI  [name], a 51 year old female with a history of hypertension and diabetes mellitus type 2 presents presents for a follow up of chronic conditions and CPE. Ms. [name] has been taking Lisinipril 10 mg consistently for hypertension. She does not follow a low fat, low sodium diet. She says that she does not check blood pressure at home.  She is active, but does not follow an exercise routine. She continues to smoke 5-10 cigarettes per day. She has attempted to quit in the past without success.  Patient denies chest pain, dyspnea, fatigue, irregular heart beat, lower extremity edema, near-syncope, palpitations and tachypnea.  Cardiovascular risk factors: diabetes mellitus, female gender, sedentary lifestyle and smoking/ tobacco exposure. Patient also has a history of DMII. She was prescribed Metformin 500 mg with breakfast. She has consistently been taking medication for 1 week.  Patient denies foot ulcerations, increase appetite, nausea, paresthesia of the feet, polydipsia, visual disturbances, vomitting and weight loss.  Evaluation to date has been included: hemoglobin A1C.    Patient also here for annual exam. The patient has minimal complaints today. The patient is sexually active. She had a pap smear in September 2017. The patient wears seatbelts. Mammogram:   Mammogram:  last mammogram: was normal  The patient does not do regular exercise. The patient has never been transfused or tattooed.   Past Medical History:  Diagnosis Date  . Hypertension    Social History   Social History  . Marital status: Single    Spouse name: N/A  . Number of children: N/A  . Years of education: N/A   Occupational History  . Not on file.   Social History Main Topics  . Smoking status: Current Every Day Smoker    Packs/day: 0.50    Types: Cigarettes  . Smokeless tobacco: Never Used  . Alcohol  use Yes     Comment: occ  . Drug use: No  . Sexual activity: Not on file   Other Topics Concern  . Not on file   Social History Narrative  . No narrative on file   Immunization History  Administered Date(s) Administered  . Pneumococcal Polysaccharide-23 08/15/2013  . Tdap 08/16/2011     Review of Systems  Constitutional: Positive for fatigue. Negative for unexpected weight change.  HENT: Negative.   Eyes: Negative.  Negative for photophobia and visual disturbance.  Respiratory: Positive for cough.   Cardiovascular: Negative.  Negative for chest pain, palpitations and leg swelling.  Endocrine: Positive for polyuria. Negative for polydipsia.  Genitourinary: Negative.   Musculoskeletal: Negative.  Negative for myalgias.  Skin: Negative.   Allergic/Immunologic: Positive for environmental allergies and immunocompromised state.  Neurological: Negative.   Hematological: Negative.   Psychiatric/Behavioral: Negative.         Objective:   Physical Exam  Constitutional: She is oriented to person, place, and time. She appears well-developed and well-nourished.  HENT:  Head: Normocephalic and atraumatic.  Right Ear: External ear normal.  Left Ear: External ear normal.  Nose: Nose normal.  Mouth/Throat: Oropharynx is clear and moist.  Eyes: Conjunctivae and EOM are normal. Pupils are equal, round, and reactive to light.  Neck: Normal range of motion. Neck supple.  Pulmonary/Chest: Effort normal and breath sounds normal.  Abdominal: Soft. Bowel sounds are normal.  Musculoskeletal: Normal range of motion.  Neurological: She is alert and oriented to  person, place, and time. She has normal strength and normal reflexes. She is not disoriented. She displays no atrophy and no tremor. No cranial nerve deficit or sensory deficit. She exhibits normal muscle tone. She displays a negative Romberg sign. She displays no seizure activity. Coordination and gait normal.  Reflex Scores:      Tricep  reflexes are 2+ on the right side and 2+ on the left side.      Bicep reflexes are 2+ on the right side and 2+ on the left side.      Brachioradialis reflexes are 2+ on the right side and 2+ on the left side.      Patellar reflexes are 2+ on the right side and 2+ on the left side.      Achilles reflexes are 2+ on the right side and 2+ on the left side. Monofilament exam negative  Skin: Skin is warm and dry.  Psychiatric: She has a normal mood and affect. Her behavior is normal. Judgment and thought content normal.      BP 112/65 (BP Location: Left Arm, Patient Position: Sitting, Cuff Size: Normal)   Pulse 79   Temp 98.2 F (36.8 C) (Oral)   Resp 16   Ht 5' 9.5" (1.765 m)   Wt 166 lb (75.3 kg)   SpO2 100%   BMI 24.16 kg/m  Assessment & Plan:   1. Essential hypertension Blood pressure is at goal on current medication regimen.  - lisinopril (PRINIVIL,ZESTRIL) 10 MG tablet; Take 1 tablet (10 mg total) by mouth daily.  Dispense: 90 tablet; Refill: 1 - Microalbumin/Creatinine Ratio, Urine - COMPLETE METABOLIC PANEL WITH GFR - Lipid Panel - CBC with Differential  2. Type 2 diabetes mellitus without complication, without long-term current use of insulin (HCC) Recommend a lowfat, low carbohydrate diet divided over 5-6 small meals, increase water intake to 6-8 glasses, and 150 minutes per week of cardiovascular exercise.   - HgB A1c - metFORMIN (GLUCOPHAGE) 500 MG tablet; Take 1 tablet (500 mg total) by mouth 2 (two) times daily with a meal.  Dispense: 180 tablet; Refill: 1 - Microalbumin/Creatinine Ratio, Urine - Ambulatory referral to Ophthalmology  3. Screening for colon cancer  - Ambulatory referral to Gastroenterology  4. Screening for HIV (human immunodeficiency virus) - HIV antibody (with reflex)  Preventative Maintenance:  Snellen eye test Referral for DM eye exam Referral for colonoscopy Immunizations up to date Reviewed pap smear, repeat in 3 years per  guidelines    RTC: 6 months for type 2 diabetes and hypertension Stuart Guillen M, FNP

## 2016-03-28 ENCOUNTER — Other Ambulatory Visit: Payer: Self-pay | Admitting: Family Medicine

## 2016-03-28 ENCOUNTER — Other Ambulatory Visit: Payer: Self-pay

## 2016-03-28 DIAGNOSIS — E785 Hyperlipidemia, unspecified: Secondary | ICD-10-CM | POA: Insufficient documentation

## 2016-03-28 LAB — MICROALBUMIN / CREATININE URINE RATIO
CREATININE, URINE: 160 mg/dL (ref 20–320)
MICROALB UR: 0.4 mg/dL
Microalb Creat Ratio: 3 mcg/mg creat (ref <30)

## 2016-03-28 MED ORDER — ASPIRIN EC 81 MG PO TBEC: 81.0000 mg | DELAYED_RELEASE_TABLET | Freq: Every day | ORAL | 11 refills | Status: DC

## 2016-03-28 MED ORDER — PRAVASTATIN SODIUM 20 MG PO TABS: 20.0000 mg | ORAL_TABLET | Freq: Every day | ORAL | 1 refills | Status: DC

## 2016-03-28 MED FILL — PRAVASTATIN NA 20 MG TAB: 20 | 30 days supply | Qty: 30 | Fill #0

## 2016-03-28 MED FILL — ?LISINOPRIL 10 MG TABLET: 10 | 30 days supply | Qty: 30 | Fill #4

## 2016-03-28 NOTE — Progress Notes (Unsigned)
Meds ordered this encounter  Medications  . pravastatin (PRAVACHOL) 20 MG tablet    Sig: Take 1 tablet (20 mg total) by mouth daily.    Dispense:  90 tablet    Refill:  1  . aspirin EC 81 MG tablet    Sig: Take 1 tablet (81 mg total) by mouth daily.    Dispense:  30 tablet    Refill:  11  Stetson Pelaez M, FNP

## 2016-03-28 NOTE — Telephone Encounter (Signed)
Called and informed patient of elevated LDL cholesterol level. Advised to start pravastatin and aspirin 81mg  as directed. Advised patient to start lowfat/ low cholesterol diet, drink 6 to 8 glasses of water daily, and exercise 150 minutes of cardio. Patient verbalized understanding and medication were sent into corrected pharmacy. Thanks!

## 2016-03-29 ENCOUNTER — Ambulatory Visit (INDEPENDENT_AMBULATORY_CARE_PROVIDER_SITE_OTHER): Payer: No Typology Code available for payment source | Admitting: Otolaryngology

## 2016-03-29 ENCOUNTER — Ambulatory Visit: Payer: No Typology Code available for payment source | Admitting: Osteopathic Medicine

## 2016-03-29 DIAGNOSIS — H6983 Other specified disorders of Eustachian tube, bilateral: Secondary | ICD-10-CM

## 2016-03-29 DIAGNOSIS — H9313 Tinnitus, bilateral: Secondary | ICD-10-CM

## 2016-03-29 DIAGNOSIS — H903 Sensorineural hearing loss, bilateral: Secondary | ICD-10-CM

## 2016-04-02 ENCOUNTER — Other Ambulatory Visit: Payer: Self-pay

## 2016-04-02 DIAGNOSIS — E785 Hyperlipidemia, unspecified: Secondary | ICD-10-CM

## 2016-04-02 DIAGNOSIS — I10 Essential (primary) hypertension: Secondary | ICD-10-CM

## 2016-04-02 DIAGNOSIS — E119 Type 2 diabetes mellitus without complications: Secondary | ICD-10-CM

## 2016-04-02 MED ORDER — LISINOPRIL 10 MG PO TABS: 10.0000 mg | ORAL_TABLET | Freq: Every day | ORAL | 3 refills | Status: DC

## 2016-04-02 MED ORDER — METFORMIN HCL 500 MG PO TABS: 500.0000 mg | ORAL_TABLET | Freq: Two times a day (BID) | ORAL | 3 refills | Status: DC

## 2016-04-02 MED ORDER — PRAVASTATIN SODIUM 20 MG PO TABS: 20.0000 mg | ORAL_TABLET | Freq: Every day | ORAL | 3 refills | Status: DC

## 2016-04-11 ENCOUNTER — Ambulatory Visit: Payer: No Typology Code available for payment source

## 2016-04-13 ENCOUNTER — Other Ambulatory Visit: Payer: Self-pay

## 2016-04-13 DIAGNOSIS — E785 Hyperlipidemia, unspecified: Secondary | ICD-10-CM

## 2016-04-13 DIAGNOSIS — I10 Essential (primary) hypertension: Secondary | ICD-10-CM

## 2016-04-13 DIAGNOSIS — E119 Type 2 diabetes mellitus without complications: Secondary | ICD-10-CM

## 2016-04-13 MED ORDER — LISINOPRIL 10 MG PO TABS: 10.0000 mg | ORAL_TABLET | Freq: Every day | ORAL | 5 refills | Status: DC

## 2016-04-13 MED ORDER — METFORMIN HCL 500 MG PO TABS: 500.0000 mg | ORAL_TABLET | Freq: Two times a day (BID) | ORAL | 5 refills | Status: DC

## 2016-04-13 MED ORDER — PRAVASTATIN SODIUM 20 MG PO TABS: 20.0000 mg | ORAL_TABLET | Freq: Every day | ORAL | 5 refills | Status: DC

## 2016-09-24 ENCOUNTER — Ambulatory Visit: Payer: Medicaid Other | Admitting: Family Medicine

## 2016-10-01 ENCOUNTER — Ambulatory Visit: Payer: Medicaid Other | Admitting: Family Medicine

## 2016-11-12 ENCOUNTER — Ambulatory Visit: Payer: Medicaid Other | Admitting: Family Medicine

## 2017-04-19 ENCOUNTER — Emergency Department
Admission: EM | Admit: 2017-04-19 | Discharge: 2017-04-19 | Disposition: A | Discharge status: Home / Self Care | Payer: Self-pay | Source: Home / Self Care

## 2017-04-19 ENCOUNTER — Other Ambulatory Visit: Payer: Self-pay

## 2017-04-19 ENCOUNTER — Emergency Department (INDEPENDENT_AMBULATORY_CARE_PROVIDER_SITE_OTHER): Payer: Self-pay

## 2017-04-19 DIAGNOSIS — L409 Psoriasis, unspecified: Secondary | ICD-10-CM

## 2017-04-19 DIAGNOSIS — R05 Cough: Secondary | ICD-10-CM

## 2017-04-19 DIAGNOSIS — F17200 Nicotine dependence, unspecified, uncomplicated: Secondary | ICD-10-CM

## 2017-04-19 DIAGNOSIS — R053 Chronic cough: Secondary | ICD-10-CM

## 2017-04-19 LAB — POCT URINALYSIS DIP (MANUAL ENTRY)
BILIRUBIN UA: NEGATIVE mg/dL
Bilirubin, UA: NEGATIVE
Blood, UA: NEGATIVE
GLUCOSE UA: NEGATIVE mg/dL
Leukocytes, UA: NEGATIVE
Nitrite, UA: NEGATIVE
Protein Ur, POC: NEGATIVE mg/dL
Urobilinogen, UA: 0.2 E.U./dL
pH, UA: 5.5 (ref 5.0–8.0)

## 2017-04-19 MED ORDER — FLUOCINOLONE ACETONIDE SCALP 0.01 % EX OIL: TOPICAL_OIL | CUTANEOUS | 1 refills | Status: DC

## 2017-04-19 MED ORDER — AZITHROMYCIN 250 MG PO TABS: ORAL_TABLET | ORAL | 0 refills | Status: DC

## 2017-04-19 NOTE — ED Triage Notes (Signed)
Pt has had a cough for about 3 weeks now.  It gets better, then worse.  Productive with green mucous at times.  Feels like it has gone to her chest. Has had urinary frequency and burning at times, but not all the time.   Has psoriasis, and would like a cream for it.

## 2017-04-19 NOTE — ED Provider Notes (Signed)
[facility]    CSN: [id number] Arrival date & time: 04/19/17  4098     History   Chief Complaint Chief Complaint  Patient presents with  . Cough  . Urinary Frequency  . Rash    HPI Barbara Lloyd is a 52 y.o. female.   Patient presents with several complaints: 1)  Three weeks ago she developed URI symptoms with productive cough, fatigue, chills, headache, and sneezing.  She has improved but still has a productive cough.  No recent fever.  No pleuritic pain or shortness of breath.  She has a past history of pneumonia. 2)  She complains of chronic rash of psoriasis in her scalp which has responded well in the past to a steroid cream or gell. 3)  She complains of one week history of intermittent urinary frequency and urgency, without dysuria, fever, or flank pain.   The history is provided by the patient.    Past Medical History:  Diagnosis Date  . Hypertension     Patient Active Problem List   Diagnosis Date Noted  . Hyperlipidemia 03/28/2016  . Type 2 diabetes mellitus without complication, without long-term current use of insulin (HCC) 03/27/2016  . Essential hypertension 08/16/2015  . Dental caries 08/16/2015  . Prediabetes 08/16/2015  . BP (high blood pressure) 08/16/2015    Past Surgical History:  Procedure Laterality Date  . CESAREAN SECTION     x2  . ENDOMETRIAL ABLATION     2006  . FOOT SURGERY    . GANGLION CYST EXCISION      OB History    No data available       Home Medications    Prior to Admission medications   Medication Sig Start Date End Date Taking? Authorizing Provider  aspirin EC 81 MG tablet Take 1 tablet (81 mg total) by mouth daily. 03/28/16   Massie Maroon, FNP  azithromycin (ZITHROMAX Z-PAK) 250 MG tablet Take 2 tabs today; then begin one tab once daily for 4 more days. 04/19/17   Lattie Haw, MD  Fluocinolone Acetonide Scalp (DERMA-SMOOTHE/FS SCALP) 0.01 % OIL Apply to scalp at bedtime; wash off next morning.  04/19/17   Lattie Haw, MD  lisinopril (PRINIVIL,ZESTRIL) 10 MG tablet Take 1 tablet (10 mg total) by mouth daily. 04/13/16   Massie Maroon, FNP  metFORMIN (GLUCOPHAGE) 500 MG tablet Take 1 tablet (500 mg total) by mouth 2 (two) times daily with a meal. 04/13/16   Massie Maroon, FNP  pravastatin (PRAVACHOL) 20 MG tablet Take 1 tablet (20 mg total) by mouth daily. 04/13/16   Massie Maroon, FNP    Family History Family History  Adopted: Yes    Social History Social History   Tobacco Use  . Smoking status: Current Every Day Smoker    Packs/day: 0.50    Types: Cigarettes  . Smokeless tobacco: Never Used  Substance Use Topics  . Alcohol use: Yes    Comment: occ  . Drug use: No     Allergies   Ciprofloxacin and Sulfa antibiotics   Review of Systems Review of Systems No sore throat + cough No pleuritic pain No wheezing No nasal congestion No post-nasal drainage No sinus pain/pressure No itchy/red eyes No earache No hemoptysis No SOB No fever/chills No nausea No vomiting No abdominal pain No diarrhea + urinary symptoms + skin rash scalp No fatigue No myalgias No headache Used OTC meds without relief   Physical Exam Triage Vital Signs ED Triage  Vitals  Enc Vitals Group     BP 04/19/17 0959 112/75     Pulse Rate 04/19/17 0959 75     Resp --      Temp 04/19/17 0959 98 F (36.7 C)     Temp Source 04/19/17 0959 Oral     SpO2 04/19/17 0959 99 %     Weight 04/19/17 1000 158 lb (71.7 kg)     Height 04/19/17 1000 5\' 9"  (1.753 m)     Head Circumference --      Peak Flow --      Pain Score 04/19/17 1000 0     Pain Loc --      Pain Edu? --      Excl. in GC? --    No data found.  Updated Vital Signs BP 112/75 (BP Location: Right Arm)   Pulse 75   Temp 98 F (36.7 C) (Oral)   Ht 5\' 9"  (1.753 m)   Wt 158 lb (71.7 kg)   SpO2 99%   BMI 23.33 kg/m   Visual Acuity Right Eye Distance:   Left Eye Distance:   Bilateral Distance:    Right Eye  Near:   Left Eye Near:    Bilateral Near:     Physical Exam Nursing notes and Vital Signs reviewed. Appearance:  Patient appears stated age, and in no acute distress Eyes:  Pupils are equal, round, and reactive to light and accomodation.  Extraocular movement is intact.  Conjunctivae are not inflamed  Ears:  Canals normal.  Tympanic membranes normal.  Nose:  Mildly congested turbinates.  No sinus tenderness.   Pharynx:  Normal Neck:  Supple.  No adenopathy.  Lungs:  Clear to auscultation.  Breath sounds are equal.  Moving air well. Heart:  Regular rate and rhythm without murmurs, rubs, or gallops.  Abdomen:  Nontender without masses or hepatosplenomegaly.  Bowel sounds are present.  No CVA or flank tenderness.  Extremities:  No edema.  Skin:  Scalp reveals scaly hyperkeratotic plaques within hairline, especially occipital area.   UC Treatments / Results  Labs (all labs ordered are listed, but only abnormal results are displayed) Labs Reviewed  POCT URINALYSIS DIP (MANUAL ENTRY) - Abnormal; Notable for the following components:      Result Value   Spec Grav, UA >=1.030 (*)    All other components within normal limits    EKG  EKG Interpretation None       Radiology Dg Chest 2 View  Result Date: 04/19/2017 CLINICAL DATA:  Cough and congestion X 3 weeks. Pt is a current smoker. EXAM: CHEST  2 VIEW COMPARISON:  Radiograph 03/06/2016 FINDINGS: Normal mediastinum and cardiac silhouette. Normal pulmonary vasculature. No evidence of effusion, infiltrate, or pneumothorax. The prominent status no acute bony abnormality. IMPRESSION: Normal chest radiograph. Electronically Signed   By: Genevive Bi M.D.   On: 04/19/2017 10:33    Procedures Procedures (including critical care time)  Medications Ordered in UC Medications - No data to display   Initial Impression / Assessment and Plan / UC Course  I have reviewed the triage vital signs and the nursing notes.  Pertinent labs &  imaging results that were available during my care of the patient were reviewed by me and considered in my medical decision making (see chart for details).    Begin Z-pak for atypical coverage. Take plain guaifenesin (1200mg  extended release tabs such as Mucinex) twice daily, with plenty of water, for cough and congestion.  Get adequate  rest.   May take Delsym Cough Suppressant at bedtime for nighttime cough.  Stop all antihistamines for now, and other non-prescription cough/cold preparations.  Note normal urinalysis; no evidence UTI.  Rx for Derma-Smooth FS for scalp (?psoriasis vs seborrheic dermatitis)  Followup with Family Doctor if not improved in one week.     Final Clinical Impressions(s) / UC Diagnoses   Final diagnoses:  Persistent cough  Psoriasis of scalp    ED Discharge Orders        Ordered    azithromycin (ZITHROMAX Z-PAK) 250 MG tablet     04/19/17 1049    Fluocinolone Acetonide Scalp (DERMA-SMOOTHE/FS SCALP) 0.01 % OIL     04/19/17 1049            Lattie HawBeese, Tashanna Dolin A, MD 04/23/17 [phone]

## 2017-04-19 NOTE — Discharge Instructions (Signed)
Take plain guaifenesin (1200mg extended release tabs such as Mucinex) twice daily, with plenty of water, for cough and congestion.  Get adequate rest.   °May take Delsym Cough Suppressant at bedtime for nighttime cough.  °Stop all antihistamines for now, and other non-prescription cough/cold preparations. °  °  °

## 2017-04-26 ENCOUNTER — Other Ambulatory Visit: Payer: Self-pay | Admitting: Family Medicine

## 2017-04-26 ENCOUNTER — Telehealth: Payer: Self-pay

## 2017-04-26 DIAGNOSIS — I10 Essential (primary) hypertension: Secondary | ICD-10-CM

## 2017-04-26 DIAGNOSIS — E785 Hyperlipidemia, unspecified: Secondary | ICD-10-CM

## 2017-04-26 DIAGNOSIS — E119 Type 2 diabetes mellitus without complications: Secondary | ICD-10-CM

## 2017-04-26 MED ORDER — LISINOPRIL 10 MG PO TABS: 10.0000 mg | ORAL_TABLET | Freq: Every day | ORAL | 0 refills | Status: DC

## 2017-04-26 MED ORDER — PRAVASTATIN SODIUM 20 MG PO TABS: 20.0000 mg | ORAL_TABLET | Freq: Every day | ORAL | 0 refills | Status: DC

## 2017-04-26 NOTE — Telephone Encounter (Signed)
Spoke with patient, she has not been seen since 03/2016. Will give one months' worth of rx until she can come in for an office visit. Patient verbalized understanding and appointment was scheduled. [facility] [place] Thanks!

## 2017-05-20 ENCOUNTER — Ambulatory Visit (INDEPENDENT_AMBULATORY_CARE_PROVIDER_SITE_OTHER): Payer: Self-pay | Admitting: Family Medicine

## 2017-05-20 ENCOUNTER — Ambulatory Visit: Payer: Self-pay | Admitting: Family Medicine

## 2017-05-20 ENCOUNTER — Encounter: Payer: Self-pay | Admitting: Family Medicine

## 2017-05-20 VITALS — BP 112/67 | HR 60 | Temp 98.1°F | Resp 14 | Ht 70.0 in | Wt 160.0 lb

## 2017-05-20 DIAGNOSIS — E119 Type 2 diabetes mellitus without complications: Secondary | ICD-10-CM

## 2017-05-20 DIAGNOSIS — I10 Essential (primary) hypertension: Secondary | ICD-10-CM

## 2017-05-20 DIAGNOSIS — E785 Hyperlipidemia, unspecified: Secondary | ICD-10-CM

## 2017-05-20 LAB — POCT URINALYSIS DIP (DEVICE)
Bilirubin Urine: NEGATIVE
Glucose, UA: NEGATIVE mg/dL
Hgb urine dipstick: NEGATIVE
KETONES UR: NEGATIVE mg/dL
Leukocytes, UA: NEGATIVE
Nitrite: NEGATIVE
PH: 5 (ref 5.0–8.0)
PROTEIN: NEGATIVE mg/dL
Urobilinogen, UA: 0.2 mg/dL (ref 0.0–1.0)

## 2017-05-20 LAB — POCT GLYCOSYLATED HEMOGLOBIN (HGB A1C): Hemoglobin A1C: 6.2

## 2017-05-20 NOTE — Progress Notes (Signed)
Subjective:    Patient ID: Barbara Lloyd, female    DOB: 12-22-1965, 52 y.o.   MRN: [id number]  HPI  Barbara Lloyd, a 52 year old female with a history of hypertension and diabetes mellitus type 2 presents presents for a follow up of chronic conditions and CPE. Barbara Lloyd has been taking Lisinipril 10 mg consistently for hypertension. She does not follow a low fat, low sodium diet. She says that she does not check blood pressure at home.  She is active, but does not follow an exercise routine.  She has attempted to quit in the past without success.  Patient denies chest pain, dyspnea, fatigue, irregular heart beat, lower extremity edema, near-syncope, palpitations and tachypnea.  Cardiovascular risk factors: diabetes mellitus, female gender, sedentary lifestyle and smoking/ tobacco exposure. Patient also has a history of DMII. She takes Metformin 500 mg BID with meals.   Patient denies foot ulcerations, increase appetite, nausea, paresthesia of the feet, polydipsia, visual disturbances, vomitting and weight loss.  Evaluation to date has been included: hemoglobin A1C.    Past Medical History:  Diagnosis Date  . Hypertension    Social History   Socioeconomic History  . Marital status: Single    Spouse name: Not on file  . Number of children: Not on file  . Years of education: Not on file  . Highest education level: Not on file  Social Needs  . Financial resource strain: Not on file  . Food insecurity - worry: Not on file  . Food insecurity - inability: Not on file  . Transportation needs - medical: Not on file  . Transportation needs - non-medical: Not on file  Occupational History  . Not on file  Tobacco Use  . Smoking status: Current Every Day Smoker    Packs/day: 0.50    Types: Cigarettes  . Smokeless tobacco: Never Used  Substance and Sexual Activity  . Alcohol use: Yes    Comment: occ  . Drug use: No  . Sexual activity: Not on file  Other Topics Concern  . Not on  file  Social History Narrative  . Not on file   Immunization History  Administered Date(s) Administered  . Pneumococcal Polysaccharide-23 08/15/2013  . Tdap 08/16/2011     Review of Systems  Constitutional: Negative for unexpected weight change.  HENT: Negative.   Eyes: Negative.  Negative for photophobia and visual disturbance.   Cardiovascular: Negative.  Negative for chest pain, palpitations and leg swelling.  Endocrine:  Negative for polydipsia or polyphagia.  Genitourinary: Negative.   Musculoskeletal: Negative.  Negative for myalgias.  Skin: Negative.   Neurological: Negative.   Hematological: Negative.   Psychiatric/Behavioral: Negative.         Objective:   Physical Exam  Constitutional: She is oriented to person, place, and time. She appears well-developed and well-nourished.  HENT:  Head: Normocephalic and atraumatic.  Right Ear: External ear normal.  Left Ear: External ear normal.  Nose: Nose normal.  Mouth/Throat: Oropharynx is clear and moist.  Eyes: Conjunctivae and EOM are normal. Pupils are equal, round, and reactive to light.  Neck: Normal range of motion. Neck supple.  Pulmonary/Chest: Effort normal and breath sounds normal.  Abdominal: Soft. Bowel sounds are normal.  Musculoskeletal: Normal range of motion.  Neurological: She is alert and oriented to person, place, and time. She has normal strength and normal reflexes. She is not disoriented. She displays no atrophy and no tremor. No cranial nerve deficit or sensory deficit. She  exhibits normal muscle tone. She displays a negative Romberg sign. She displays no seizure activity. Coordination and gait normal.  Monofilament exam negative  Skin: Skin is warm and dry.  Psychiatric: She has a normal mood and affect. Her behavior is normal. Judgment and thought content normal.      BP 112/67 (BP Location: Right Arm, Patient Position: Sitting, Cuff Size: Normal)   Pulse 60   Temp 98.1 F (36.7 C) (Oral)    Resp 14   Ht 5\' 10"  (1.778 m)   Wt 160 lb (72.6 kg)   SpO2 100%   BMI 22.96 kg/m  Assessment & Plan:  1. Type 2 diabetes mellitus without complication, without long-term current use of insulin (HCC) Hemoglobin a1C is 6.2, which is at goal.  Will continue Metformin 500 mg BID with meals. Recommend a lowfat, low carbohydrate diet divided over 5-6 small meals, increase water intake to 6-8 glasses, and 150 minutes per week of cardiovascular exercise.    - HgB A1c - POCT urinalysis dip (device) - Lipid Panel - Basic Metabolic Panel  2. Essential hypertension Blood pressure is at goal, no medication changes warranted - Continue medication, monitor blood pressure at home. Continue DASH diet. Reminder to go to the ER if any CP, SOB, nausea, dizziness, severe HA, changes vision/speech, left arm numbness and tingling and jaw pain.    - POCT urinalysis dip (device) - Basic Metabolic Panel  3. Hyperlipidemia, unspecified hyperlipidemia type The 10-year ASCVD risk score Denman George DC Jr., et al., 2013) is: 10.1%   Values used to calculate the score:     Age: 52 years     Sex: Female     Is Non-Hispanic African American: No     Diabetic: Yes     Tobacco smoker: Yes     Systolic Blood Pressure: 112 mmHg     Is BP treated: Yes     HDL Cholesterol: 40 mg/dL     Total Cholesterol: 192 mg/dL - Lipid Panel  RTC; 6 months for chronic conditions   Nolon Nations  MSN, FNP-C Patient Care [facility] [address] [place], [place] [address] [phone]

## 2017-05-20 NOTE — Patient Instructions (Signed)
Your A1c has decreased to 6.2, which is at goal.  Will continue metformin 500 mg twice daily with meals.  Also recommend that you continue a carbohydrate modify diet and daily exercise regimen.  Increase water intake to 3-4 bottles per day.   Your blood pressure is at goal today, no medication changes warranted. - Continue medication, monitor blood pressure at home. Continue DASH diet. Reminder to go to the ER if any CP, SOB, nausea, dizziness, severe HA, changes vision/speech, left arm numbness and tingling and jaw pain.     We will follow-up by phone with any abnormal laboratory results.   We will schedule a 5646-month follow-up.

## 2017-05-21 ENCOUNTER — Other Ambulatory Visit: Payer: Self-pay | Admitting: Family Medicine

## 2017-05-21 ENCOUNTER — Telehealth: Payer: Self-pay

## 2017-05-21 DIAGNOSIS — E785 Hyperlipidemia, unspecified: Secondary | ICD-10-CM

## 2017-05-21 DIAGNOSIS — I10 Essential (primary) hypertension: Secondary | ICD-10-CM

## 2017-05-21 DIAGNOSIS — E119 Type 2 diabetes mellitus without complications: Secondary | ICD-10-CM

## 2017-05-21 LAB — BASIC METABOLIC PANEL
BUN / CREAT RATIO: 21 (ref 9–23)
BUN: 15 mg/dL (ref 6–24)
CO2: 23 mmol/L (ref 20–29)
CREATININE: 0.7 mg/dL (ref 0.57–1.00)
Calcium: 9.4 mg/dL (ref 8.7–10.2)
Chloride: 101 mmol/L (ref 96–106)
GFR calc Af Amer: 116 mL/min/{1.73_m2} (ref >59)
GFR, EST NON AFRICAN AMERICAN: 101 mL/min/{1.73_m2} (ref >59)
Glucose: 117 mg/dL — ABNORMAL HIGH (ref 65–99)
Potassium: 4.6 mmol/L (ref 3.5–5.2)
SODIUM: 140 mmol/L (ref 134–144)

## 2017-05-21 LAB — LIPID PANEL
CHOLESTEROL TOTAL: 166 mg/dL (ref 100–199)
Chol/HDL Ratio: 3.8 ratio (ref 0.0–4.4)
HDL: 44 mg/dL (ref >39)
LDL Calculated: 97 mg/dL (ref 0–99)
Triglycerides: 124 mg/dL (ref 0–149)
VLDL CHOLESTEROL CAL: 25 mg/dL (ref 5–40)

## 2017-05-21 MED ORDER — LISINOPRIL 10 MG PO TABS: 10.0000 mg | ORAL_TABLET | Freq: Every day | ORAL | 1 refills | Status: DC

## 2017-05-21 MED ORDER — METFORMIN HCL 500 MG PO TABS: 500.0000 mg | ORAL_TABLET | Freq: Two times a day (BID) | ORAL | 1 refills | Status: DC

## 2017-05-21 MED ORDER — PRAVASTATIN SODIUM 10 MG PO TABS: 20.0000 mg | ORAL_TABLET | Freq: Every day | ORAL | 1 refills | Status: DC

## 2017-05-21 NOTE — Telephone Encounter (Signed)
Called and spoke with patient, advised that cholesterol levels are within a normal range and that we are decreasing pravastatin to 10mg  every evening with dinner. Asked that she keep a 6 month follow up and informed that medications have been sent electronically. Thanks!

## 2017-05-21 NOTE — Progress Notes (Signed)
Meds ordered this encounter  Medications  . pravastatin (PRAVACHOL) 10 MG tablet    Sig: Take 2 tablets (20 mg total) by mouth daily.    Dispense:  90 tablet    Refill:  1  . metFORMIN (GLUCOPHAGE) 500 MG tablet    Sig: Take 1 tablet (500 mg total) by mouth 2 (two) times daily with a meal.    Dispense:  180 tablet    Refill:  1  . lisinopril (PRINIVIL,ZESTRIL) 10 MG tablet    Sig: Take 1 tablet (10 mg total) by mouth daily.    Dispense:  90 tablet    Refill:  1   [name]  MSN, FNP-C Patient [facility] [address]  [place], [place] [id number] [phone]

## 2017-05-21 NOTE — Telephone Encounter (Signed)
-----   Message from Massie MaroonLachina M Hollis, [place]NP sent at 05/21/2017  6:12 AM EST ----- Regarding: lab results Please inform patient that cholesterol levels are within a normal range. Decreased Pravastatin to 10 mg every evening with dinner.   Follow up in 6 months as scheduled. All medications have been sent electronically.   Thanks

## 2017-11-24 ENCOUNTER — Other Ambulatory Visit: Payer: Self-pay | Admitting: Family Medicine

## 2017-11-24 DIAGNOSIS — I10 Essential (primary) hypertension: Secondary | ICD-10-CM

## 2017-11-26 ENCOUNTER — Other Ambulatory Visit: Payer: Self-pay | Admitting: Family Medicine

## 2017-11-26 DIAGNOSIS — E785 Hyperlipidemia, unspecified: Secondary | ICD-10-CM

## 2017-11-27 ENCOUNTER — Ambulatory Visit: Payer: Self-pay | Admitting: Family Medicine

## 2017-12-04 ENCOUNTER — Encounter: Payer: Self-pay | Admitting: Emergency Medicine

## 2017-12-04 ENCOUNTER — Emergency Department
Admission: EM | Admit: 2017-12-04 | Discharge: 2017-12-04 | Disposition: A | Discharge status: Home / Self Care | Payer: Self-pay | Source: Home / Self Care | Attending: Family Medicine | Admitting: Family Medicine

## 2017-12-04 DIAGNOSIS — S39012A Strain of muscle, fascia and tendon of lower back, initial encounter: Secondary | ICD-10-CM

## 2017-12-04 MED ORDER — HYDROCODONE-ACETAMINOPHEN 5-325 MG PO TABS: 1.0000 | ORAL_TABLET | Freq: Four times a day (QID) | ORAL | 0 refills | Status: DC | PRN

## 2017-12-04 MED ORDER — PREDNISONE 20 MG PO TABS: ORAL_TABLET | ORAL | 0 refills | Status: DC

## 2017-12-04 MED ORDER — CYCLOBENZAPRINE HCL 10 MG PO TABS: 10.0000 mg | ORAL_TABLET | Freq: Every day | ORAL | 1 refills | Status: DC

## 2017-12-04 NOTE — ED Provider Notes (Signed)
[facility]    CSN: [id number] Arrival date & time: 12/04/17  1624     History   Chief Complaint Chief Complaint  Patient presents with  . Back Pain    HPI Barbara Lloyd is a 52 y.o. female.   Patient was lifting furniture for a yard sale 5 days ago.  She gradually has developed right lower back pain that acutely became worse this morning, awakening her at 5:30am.  Ibuprofen 800mg  has not been helpful.  She denies bowel or bladder dysfunction, and no saddle numbness.  This morning she developed mild radiation of pain to her right lateral thigh. Review of chart records reveals that she had left sided back pain in 2016, and LS spine X-rays ([facility]) showed degenerative changes at L1-2, L2-3, and L3-4.  The history is provided by the patient.  Back Pain  Location:  Lumbar spine and sacro-iliac joint Quality:  Aching Radiates to:  L posterior upper leg Pain severity:  Moderate Pain is:  Same all the time Onset quality:  Gradual Duration:  3 days Timing:  Constant Progression:  Worsening Chronicity:  New Context: lifting heavy objects   Relieved by:  Nothing Worsened by:  Ambulation, lying down and movement Ineffective treatments:  NSAIDs Associated symptoms: no abdominal pain, no bladder incontinence, no bowel incontinence, no dysuria, no fever, no leg pain, no numbness, no paresthesias, no pelvic pain, no perianal numbness, no tingling, no weakness and no weight loss     Past Medical History:  Diagnosis Date  . Hypertension     Patient Active Problem List   Diagnosis Date Noted  . Hyperlipidemia 03/28/2016  . Type 2 diabetes mellitus without complication, without long-term current use of insulin (HCC) 03/27/2016  . Essential hypertension 08/16/2015  . Dental caries 08/16/2015  . BP (high blood pressure) 08/16/2015    Past Surgical History:  Procedure Laterality Date  . CESAREAN SECTION     x2  . ENDOMETRIAL ABLATION     2006  . FOOT SURGERY      . GANGLION CYST EXCISION      OB History   None      Home Medications    Prior to Admission medications   Medication Sig Start Date End Date Taking? Authorizing Provider  aspirin EC 81 MG tablet Take 1 tablet (81 mg total) by mouth daily. Patient not taking: Reported on 05/20/2017 03/28/16   Massie Maroon, FNP  cyclobenzaprine (FLEXERIL) 10 MG tablet Take 1 tablet (10 mg total) by mouth at bedtime. 12/04/17   Lattie Haw, MD  HYDROcodone-acetaminophen (NORCO/VICODIN) 5-325 MG tablet Take 1 tablet by mouth every 6 (six) hours as needed for moderate pain. 12/04/17   Lattie Haw, MD  lisinopril (PRINIVIL,ZESTRIL) 10 MG tablet TAKE 1 TABLET BY MOUTH ONCE DAILY 11/25/17   Mike Gip, FNP  metFORMIN (GLUCOPHAGE) 500 MG tablet Take 1 tablet (500 mg total) by mouth 2 (two) times daily with a meal. 05/21/17   Massie Maroon, FNP  pravastatin (PRAVACHOL) 10 MG tablet TAKE 2 TABLETS BY MOUTH ONCE DAILY 11/26/17   Mike Gip, FNP  predniSONE (DELTASONE) 20 MG tablet Take one tab by mouth twice daily for 4 days, then one daily.. Take with food. 12/04/17   Lattie Haw, MD    Family History Family History  Adopted: Yes    Social History Social History   Tobacco Use  . Smoking status: Current Every Day Smoker    Packs/day: 0.50  Types: Cigarettes  . Smokeless tobacco: Never Used  Substance Use Topics  . Alcohol use: Yes    Comment: occ  . Drug use: No     Allergies   Ciprofloxacin and Sulfa antibiotics   Review of Systems Review of Systems  Constitutional: Negative for fever and weight loss.  Gastrointestinal: Negative for abdominal pain and bowel incontinence.  Genitourinary: Negative for bladder incontinence, dysuria and pelvic pain.  Musculoskeletal: Positive for back pain.  Neurological: Negative for tingling, weakness, numbness and paresthesias.  All other systems reviewed and are negative.    Physical Exam Triage Vital Signs ED Triage Vitals   Enc Vitals Group     BP 12/04/17 1704 132/73     Pulse Rate 12/04/17 1704 80     Resp --      Temp 12/04/17 1704 98.5 F (36.9 C)     Temp Source 12/04/17 1704 Oral     SpO2 12/04/17 1704 100 %     Weight 12/04/17 1705 160 lb (72.6 kg)     Height --      Head Circumference --      Peak Flow --      Pain Score 12/04/17 1705 8     Pain Loc --      Pain Edu? --      Excl. in GC? --    No data found.  Updated Vital Signs BP 132/73 (BP Location: Right Arm)   Pulse 80   Temp 98.5 F (36.9 C) (Oral)   Wt 72.6 kg   SpO2 100%   BMI 22.96 kg/m   Visual Acuity Right Eye Distance:   Left Eye Distance:   Bilateral Distance:    Right Eye Near:   Left Eye Near:    Bilateral Near:     Physical Exam  Constitutional: She appears well-developed and well-nourished. No distress.  HENT:  Head: Normocephalic.  Right Ear: External ear normal.  Left Ear: External ear normal.  Nose: Nose normal.  Mouth/Throat: Oropharynx is clear and moist.  Eyes: Pupils are equal, round, and reactive to light. Conjunctivae are normal.  Neck: Normal range of motion.  Cardiovascular: Normal heart sounds.  Pulmonary/Chest: Breath sounds normal.  Abdominal: There is no tenderness.  Musculoskeletal: She exhibits no edema.       Lumbar back: She exhibits tenderness and bony tenderness. She exhibits no swelling and no edema.       Back:  Back:  Range of motion relatively well preserved.  Can heel/toe walk and squat without difficulty.   Tenderness in the midline and right paraspinous muscles from approximately T11 to Sacral area.  Straight leg raising test is negative.  Sitting knee extension test is negative.  Strength and sensation in the lower extremities is normal.  Patellar and achilles reflexes are normal   Neurological: She is alert. She displays normal reflexes. No sensory deficit. She exhibits normal muscle tone.  Skin: Skin is warm and dry. No rash noted.  Nursing note and vitals  reviewed.    UC Treatments / Results  Labs (all labs ordered are listed, but only abnormal results are displayed) Labs Reviewed - No data to display  EKG None  Radiology No results found.  Procedures Procedures (including critical care time)  Medications Ordered in UC Medications - No data to display  Initial Impression / Assessment and Plan / UC Course  I have reviewed the triage vital signs and the nursing notes.  Pertinent labs & imaging results that were  available during my care of the patient were reviewed by me and considered in my medical decision making (see chart for details).    Begin prednisone burst/taper, and Flexeril at bedtime. Rx for Lortab (#12, no refill) Followup with Dr. Rodney Langton or Dr. Clementeen [facility] ([facility]) if not improving about two weeks.    Final Clinical Impressions(s) / UC Diagnoses   Final diagnoses:  Strain of lumbar region, initial encounter     Discharge Instructions     Apply ice pack for 20 to 30 minutes, 3 to 4 times daily  Continue until pain and swelling decrease.  Begin range of motion and stretching exercises as tolerated.    ED Prescriptions    Medication Sig Dispense Auth. Provider   HYDROcodone-acetaminophen (NORCO/VICODIN) 5-325 MG tablet Take 1 tablet by mouth every 6 (six) hours as needed for moderate pain. 12 tablet Lattie Haw, MD   predniSONE (DELTASONE) 20 MG tablet Take one tab by mouth twice daily for 4 days, then one daily.. Take with food. 12 tablet Lattie Haw, MD   cyclobenzaprine (FLEXERIL) 10 MG tablet Take 1 tablet (10 mg total) by mouth at bedtime. 10 tablet Lattie Haw, MD         Lattie Haw, MD 12/08/17 [phone]

## 2017-12-04 NOTE — Discharge Instructions (Addendum)
Apply ice pack for 20 to 30 minutes, 3 to 4 times daily  Continue until pain and swelling decrease.  Begin range of motion and stretching exercises as tolerated. 

## 2017-12-04 NOTE — ED Triage Notes (Signed)
Pt c/o low back pain only on the right side after heavy lifting. States motrin does not help.

## 2018-02-19 ENCOUNTER — Encounter: Payer: Self-pay | Admitting: *Deleted

## 2018-02-19 ENCOUNTER — Other Ambulatory Visit: Payer: Self-pay

## 2018-02-19 ENCOUNTER — Emergency Department
Admission: EM | Admit: 2018-02-19 | Discharge: 2018-02-19 | Disposition: A | Discharge status: Home / Self Care | Payer: Self-pay | Source: Home / Self Care | Attending: Family Medicine | Admitting: Family Medicine

## 2018-02-19 DIAGNOSIS — R300 Dysuria: Secondary | ICD-10-CM

## 2018-02-19 DIAGNOSIS — J069 Acute upper respiratory infection, unspecified: Secondary | ICD-10-CM

## 2018-02-19 MED ORDER — CEPHALEXIN 500 MG PO CAPS: 500.0000 mg | ORAL_CAPSULE | Freq: Two times a day (BID) | ORAL | 0 refills | Status: DC

## 2018-02-19 MED ORDER — GUAIFENESIN-CODEINE 100-10 MG/5ML PO SYRP: 5.0000 mL | ORAL_SOLUTION | Freq: Every evening | ORAL | 0 refills | Status: AC

## 2018-02-19 NOTE — ED Triage Notes (Signed)
Patient c/o 1 week of productive cough, aches, nasal congestion, sneezing and fatigue. Also c/o dysuria x 2 days. Taking AZO, tylenol and IBF.

## 2018-02-19 NOTE — ED Provider Notes (Signed)
[facility]    CSN: [id number] Arrival date & time: 02/19/18  1726     History   Chief Complaint Chief Complaint  Patient presents with  . Cough  . Generalized Body Aches  . Dysuria    HPI Barbara Lloyd is a 52 y.o. female.   HPI  Barbara Lloyd is a 52 y.o. female presenting to UC with c/o productive cough with body aches, nasal congestion, sneezing and fatigue for 1 week. Two days of dysuria. She has taken Azo, Tylenol and Ibuprofen with moderate relief.  Denies known sick contacts. Cough is most bothersome for pt, keeping her up at night. Denies known fever. Denies n/v/d.    Past Medical History:  Diagnosis Date  . Hypertension     Patient Active Problem List   Diagnosis Date Noted  . Hyperlipidemia 03/28/2016  . Type 2 diabetes mellitus without complication, without long-term current use of insulin (HCC) 03/27/2016  . Essential hypertension 08/16/2015  . Dental caries 08/16/2015  . BP (high blood pressure) 08/16/2015    Past Surgical History:  Procedure Laterality Date  . CESAREAN SECTION     x2  . ENDOMETRIAL ABLATION     2006  . FOOT SURGERY    . GANGLION CYST EXCISION      OB History   None      Home Medications    Prior to Admission medications   Medication Sig Start Date End Date Taking? Authorizing Provider  aspirin EC 81 MG tablet Take 1 tablet (81 mg total) by mouth daily. Patient not taking: Reported on 05/20/2017 03/28/16   Massie MaroonHollis, Lachina M, FNP  cephALEXin (KEFLEX) 500 MG capsule Take 1 capsule (500 mg total) by mouth 2 (two) times daily. 02/19/18   Lurene ShadowPhelps, Helane Briceno O, PA-C  cyclobenzaprine (FLEXERIL) 10 MG tablet Take 1 tablet (10 mg total) by mouth at bedtime. 12/04/17   Lattie HawBeese, Stephen A, MD  guaiFENesin-codeine (ROBITUSSIN AC) 100-10 MG/5ML syrup Take 5-10 mLs by mouth Nightly for 5 days. 02/19/18 02/24/18  Lurene ShadowPhelps, Ariah Mower O, PA-C  HYDROcodone-acetaminophen (NORCO/VICODIN) 5-325 MG tablet Take 1 tablet by mouth every 6 (six) hours  as needed for moderate pain. 12/04/17   Lattie HawBeese, Stephen A, MD  lisinopril (PRINIVIL,ZESTRIL) 10 MG tablet TAKE 1 TABLET BY MOUTH ONCE DAILY 11/25/17   Mike Gipouglas, Andre, FNP  metFORMIN (GLUCOPHAGE) 500 MG tablet Take 1 tablet (500 mg total) by mouth 2 (two) times daily with a meal. 05/21/17   Massie MaroonHollis, Lachina M, FNP  pravastatin (PRAVACHOL) 10 MG tablet TAKE 2 TABLETS BY MOUTH ONCE DAILY 11/26/17   Mike Gipouglas, Andre, FNP    Family History Family History  Adopted: Yes    Social History Social History   Tobacco Use  . Smoking status: Current Every Day Smoker    Packs/day: 0.50    Types: Cigarettes  . Smokeless tobacco: Never Used  Substance Use Topics  . Alcohol use: Yes    Comment: occ  . Drug use: No     Allergies   Ciprofloxacin and Sulfa antibiotics   Review of Systems Review of Systems  Constitutional: Positive for fatigue. Negative for chills and fever.  HENT: Positive for congestion, rhinorrhea and sneezing. Negative for ear pain, sore throat, trouble swallowing and voice change.   Respiratory: Positive for cough. Negative for shortness of breath.   Cardiovascular: Negative for chest pain and palpitations.  Gastrointestinal: Negative for abdominal pain, diarrhea, nausea and vomiting.  Genitourinary: Positive for dysuria. Negative for frequency and urgency.  Musculoskeletal: Positive  for arthralgias, back pain and myalgias.  Skin: Negative for rash.     Physical Exam Triage Vital Signs ED Triage Vitals  Enc Vitals Group     BP 02/19/18 1742 131/85     Pulse Rate 02/19/18 1742 77     Resp 02/19/18 1742 16     Temp 02/19/18 1742 98.7 F (37.1 C)     Temp Source 02/19/18 1742 Oral     SpO2 02/19/18 1742 99 %     Weight 02/19/18 1743 164 lb (74.4 kg)     Height --      Head Circumference --      Peak Flow --      Pain Score 02/19/18 1743 0     Pain Loc --      Pain Edu? --      Excl. in GC? --    No data found.  Updated Vital Signs BP 131/85 (BP Location: Right  Arm)   Pulse 77   Temp 98.7 F (37.1 C) (Oral)   Resp 16   Wt 164 lb (74.4 kg)   SpO2 99%   BMI 23.53 kg/m   Visual Acuity Right Eye Distance:   Left Eye Distance:   Bilateral Distance:    Right Eye Near:   Left Eye Near:    Bilateral Near:     Physical Exam  Constitutional: She is oriented to person, place, and time. She appears well-developed and well-nourished. No distress.  HENT:  Head: Normocephalic and atraumatic.  Right Ear: Tympanic membrane normal.  Left Ear: Tympanic membrane normal.  Nose: Nose normal. Right sinus exhibits no maxillary sinus tenderness and no frontal sinus tenderness. Left sinus exhibits no maxillary sinus tenderness and no frontal sinus tenderness.  Mouth/Throat: Uvula is midline, oropharynx is clear and moist and mucous membranes are normal.  Eyes: EOM are normal.  Neck: Normal range of motion.  Cardiovascular: Normal rate and regular rhythm.  Pulmonary/Chest: Effort normal and breath sounds normal. No stridor. No respiratory distress. She has no wheezes. She has no rales.  Abdominal: Soft. She exhibits no distension. There is no tenderness. There is no CVA tenderness.  Musculoskeletal: Normal range of motion.  Neurological: She is alert and oriented to person, place, and time.  Skin: Skin is warm and dry. She is not diaphoretic.  Psychiatric: She has a normal mood and affect. Her behavior is normal.  Nursing note and vitals reviewed.    UC Treatments / Results  Labs (all labs ordered are listed, but only abnormal results are displayed) Labs Reviewed  URINE CULTURE    EKG None  Radiology No results found.  Procedures Procedures (including critical care time)  Medications Ordered in UC Medications - No data to display  Initial Impression / Assessment and Plan / UC Course  I have reviewed the triage vital signs and the nursing notes.  Pertinent labs & imaging results that were available during my care of the patient were  reviewed by me and considered in my medical decision making (see chart for details).     Pt took Azo before coming to UC, cannot run UA, Urine culture sent. Will start pt on Keflex while culture pending, will also cover for potential bacterial infection given URI symptoms but lungs- CTAB and no evidence of AOM or strep on exam.  Final Clinical Impressions(s) / UC Diagnoses   Final diagnoses:  Dysuria  Upper respiratory tract infection, unspecified type     Discharge Instructions  Virtussin (guaifenesin-codeine) is a strong narcotic cough medication.  Only take up to 3 times daily as needed for severe cough.  Be sure to take with large glass of water.  It may cause drowsiness. Do not drive, drink alcohol or take other sedating medications such as Nyquil while taking this medication.   You may take 500mg  acetaminophen every 4-6 hours or in combination with ibuprofen 400-600mg  every 6-8 hours as needed for pain, inflammation, and fever.  Be sure to well hydrated with clear liquids and get at least 8 hours of sleep at night, preferably more while sick.   Please follow up with family medicine in 1 week if needed.  Please take your antibiotic as prescribed for your UTI. A urine culture has been sent to check the severity of your urinary infection and to determine if you are on the most appropriate antibiotic. The results should come back within 2-3 days and you will be notified even if no medication change is needed.  Please stay well hydrated and follow up with your family doctor in 1 week if not improving, sooner if worsening.     ED Prescriptions    Medication Sig Dispense Auth. Provider   guaiFENesin-codeine (ROBITUSSIN AC) 100-10 MG/5ML syrup Take 5-10 mLs by mouth Nightly for 5 days. 50 mL Doroteo Glassman, Daphne Karrer O, PA-C   cephALEXin (KEFLEX) 500 MG capsule Take 1 capsule (500 mg total) by mouth 2 (two) times daily. 14 capsule Lurene Shadow, PA-C     Controlled Substance  Prescriptions [place] Controlled Substance Registry consulted? Not Applicable   Rolla Plate 02/19/18 1949

## 2018-02-19 NOTE — Discharge Instructions (Signed)
Virtussin (guaifenesin-codeine) is a strong narcotic cough medication.  Only take up to 3 times daily as needed for severe cough.  Be sure to take with large glass of water.  It may cause drowsiness. Do not drive, drink alcohol or take other sedating medications such as Nyquil while taking this medication.   You may take 500mg  acetaminophen every 4-6 hours or in combination with ibuprofen 400-600mg  every 6-8 hours as needed for pain, inflammation, and fever.  Be sure to well hydrated with clear liquids and get at least 8 hours of sleep at night, preferably more while sick.   Please follow up with family medicine in 1 week if needed.  Please take your antibiotic as prescribed for your UTI. A urine culture has been sent to check the severity of your urinary infection and to determine if you are on the most appropriate antibiotic. The results should come back within 2-3 days and you will be notified even if no medication change is needed.  Please stay well hydrated and follow up with your family doctor in 1 week if not improving, sooner if worsening.

## 2018-02-21 ENCOUNTER — Telehealth: Payer: Self-pay

## 2018-02-21 LAB — URINE CULTURE
MICRO NUMBER:: 91452309
SPECIMEN QUALITY:: ADEQUATE

## 2018-02-21 NOTE — Telephone Encounter (Signed)
Spoke with patient, given lab results, and to continue medication as prescribed.

## 2018-02-26 ENCOUNTER — Encounter: Payer: Self-pay | Admitting: Family Medicine

## 2018-02-26 ENCOUNTER — Other Ambulatory Visit: Payer: Self-pay

## 2018-02-26 ENCOUNTER — Emergency Department
Admission: EM | Admit: 2018-02-26 | Discharge: 2018-02-26 | Disposition: A | Discharge status: Home / Self Care | Payer: Self-pay | Source: Home / Self Care

## 2018-02-26 DIAGNOSIS — J0100 Acute maxillary sinusitis, unspecified: Secondary | ICD-10-CM

## 2018-02-26 MED ORDER — AMOXICILLIN-POT CLAVULANATE 875-125 MG PO TABS: 1.0000 | ORAL_TABLET | Freq: Two times a day (BID) | ORAL | 0 refills | Status: DC

## 2018-02-26 MED ORDER — HYDROCODONE-HOMATROPINE 5-1.5 MG/5ML PO SYRP: 5.0000 mL | ORAL_SOLUTION | Freq: Four times a day (QID) | ORAL | 0 refills | Status: DC | PRN

## 2018-02-26 NOTE — ED Triage Notes (Signed)
Seen for URI, and UTI last week.  Now has head congestion, and pressure.

## 2018-02-26 NOTE — ED Provider Notes (Signed)
[facility]    CSN: [id number] Arrival date & time: 02/26/18  1553     History   Chief Complaint Chief Complaint  Patient presents with  . Cough  . Nasal Congestion    HPI Barbara Lloyd is a 52 y.o. female.   52 yo woman seen here 7 days ago for URI with cough.  She had a UTI at the time and was treated with Keflex.  She comes back today with persistence of the cough.  She is a smoker.  Nasal discharge is green.  Patient trying to stop smoking.  Note:  Patient has h/o dental caries, hypertension, type 2 diabetes     Past Medical History:  Diagnosis Date  . Hypertension     Patient Active Problem List   Diagnosis Date Noted  . Hyperlipidemia 03/28/2016  . Type 2 diabetes mellitus without complication, without long-term current use of insulin (HCC) 03/27/2016  . Essential hypertension 08/16/2015  . Dental caries 08/16/2015  . BP (high blood pressure) 08/16/2015    Past Surgical History:  Procedure Laterality Date  . CESAREAN SECTION     x2  . ENDOMETRIAL ABLATION     2006  . FOOT SURGERY    . GANGLION CYST EXCISION      OB History   None      Home Medications    Prior to Admission medications   Medication Sig Start Date End Date Taking? Authorizing Provider  amoxicillin-clavulanate (AUGMENTIN) 875-125 MG tablet Take 1 tablet by mouth every 12 (twelve) hours. 02/26/18   Barbara SidleLauenstein, Jodine Muchmore, MD  cyclobenzaprine (FLEXERIL) 10 MG tablet Take 1 tablet (10 mg total) by mouth at bedtime. 12/04/17   Lattie HawBeese, Stephen A, MD  HYDROcodone-homatropine (HYDROMET) 5-1.5 MG/5ML syrup Take 5 mLs by mouth every 6 (six) hours as needed for cough. 02/26/18   Barbara SidleLauenstein, Jael Kostick, MD  lisinopril (PRINIVIL,ZESTRIL) 10 MG tablet TAKE 1 TABLET BY MOUTH ONCE DAILY 11/25/17   Mike Gipouglas, Andre, FNP  metFORMIN (GLUCOPHAGE) 500 MG tablet Take 1 tablet (500 mg total) by mouth 2 (two) times daily with a meal. 05/21/17   Massie MaroonHollis, Lachina M, FNP  pravastatin (PRAVACHOL) 10 MG tablet  TAKE 2 TABLETS BY MOUTH ONCE DAILY 11/26/17   Mike Gipouglas, Andre, FNP    Family History Family History  Adopted: Yes    Social History Social History   Tobacco Use  . Smoking status: Current Every Day Smoker    Packs/day: 0.50    Types: Cigarettes  . Smokeless tobacco: Never Used  Substance Use Topics  . Alcohol use: Yes    Comment: occ  . Drug use: No     Allergies   Ciprofloxacin and Sulfa antibiotics   Review of Systems Review of Systems   Physical Exam Triage Vital Signs ED Triage Vitals  Enc Vitals Group     BP      Pulse      Resp      Temp      Temp src      SpO2      Weight      Height      Head Circumference      Peak Flow      Pain Score      Pain Loc      Pain Edu?      Excl. in GC?    No data found.  Updated Vital Signs BP 108/71 (BP Location: Right Arm)   Pulse 83   Temp 98.3 F (36.8  C) (Oral)   Resp 18   Ht 5\' 10"  (1.778 m)   Wt 74.8 kg   SpO2 99%   BMI 23.68 kg/m    Physical Exam  Constitutional: She is oriented to person, place, and time. She appears well-developed and well-nourished.  HENT:  Right Ear: External ear normal.  Left Ear: External ear normal.  Mouth/Throat: Oropharynx is clear and moist.  Swollen nasal passages  Eyes: Conjunctivae are normal.  Neck: Normal range of motion. Neck supple.  Pulmonary/Chest: Effort normal and breath sounds normal.  Musculoskeletal: Normal range of motion.  Neurological: She is alert and oriented to person, place, and time.  Skin: Skin is warm and dry.  Nursing note and vitals reviewed.    UC Treatments / Results  Labs (all labs ordered are listed, but only abnormal results are displayed) Labs Reviewed - No data to display  EKG None  Radiology No results found.  Procedures Procedures (including critical care time)  Medications Ordered in UC Medications - No data to display  Initial Impression / Assessment and Plan / UC Course  I have reviewed the triage vital signs  and the nursing notes.  Pertinent labs & imaging results that were available during my care of the patient were reviewed by me and considered in my medical decision making (see chart for details).    Final Clinical Impressions(s) / UC Diagnoses   Final diagnoses:  Acute non-recurrent maxillary sinusitis   Discharge Instructions   None    ED Prescriptions    Medication Sig Dispense Auth. Provider   HYDROcodone-homatropine (HYDROMET) 5-1.5 MG/5ML syrup Take 5 mLs by mouth every 6 (six) hours as needed for cough. 60 mL Barbara Sidle, MD   amoxicillin-clavulanate (AUGMENTIN) 875-125 MG tablet Take 1 tablet by mouth every 12 (twelve) hours. 14 tablet Barbara Sidle, MD     Controlled Substance Prescriptions [place] Controlled Substance Registry consulted? Not Applicable   Barbara Sidle, MD 02/26/18 [phone]

## 2018-04-16 ENCOUNTER — Encounter: Payer: Self-pay | Admitting: Family Medicine

## 2018-04-16 ENCOUNTER — Ambulatory Visit (INDEPENDENT_AMBULATORY_CARE_PROVIDER_SITE_OTHER): Payer: Self-pay | Admitting: Family Medicine

## 2018-04-16 VITALS — BP 117/70 | HR 81 | Temp 98.0°F | Resp 14 | Ht 70.0 in | Wt 162.0 lb

## 2018-04-16 DIAGNOSIS — Z716 Tobacco abuse counseling: Secondary | ICD-10-CM

## 2018-04-16 DIAGNOSIS — F17209 Nicotine dependence, unspecified, with unspecified nicotine-induced disorders: Secondary | ICD-10-CM

## 2018-04-16 DIAGNOSIS — E119 Type 2 diabetes mellitus without complications: Secondary | ICD-10-CM

## 2018-04-16 DIAGNOSIS — E785 Hyperlipidemia, unspecified: Secondary | ICD-10-CM

## 2018-04-16 DIAGNOSIS — I10 Essential (primary) hypertension: Secondary | ICD-10-CM

## 2018-04-16 DIAGNOSIS — L409 Psoriasis, unspecified: Secondary | ICD-10-CM

## 2018-04-16 LAB — POCT GLYCOSYLATED HEMOGLOBIN (HGB A1C): Hemoglobin A1C: 6.4 % — AB (ref 4.0–5.6)

## 2018-04-16 LAB — POCT URINALYSIS DIPSTICK
Bilirubin, UA: NEGATIVE
Blood, UA: NEGATIVE
Glucose, UA: NEGATIVE
Ketones, UA: NEGATIVE
Leukocytes, UA: NEGATIVE
Nitrite, UA: NEGATIVE
Protein, UA: NEGATIVE
Spec Grav, UA: >=1.03 — AB (ref 1.010–1.025)
Urobilinogen, UA: 0.2 E.U./dL
pH, UA: 5.5 (ref 5.0–8.0)

## 2018-04-16 MED ORDER — LISINOPRIL 10 MG PO TABS: 10.0000 mg | ORAL_TABLET | Freq: Every day | ORAL | 1 refills | Status: DC

## 2018-04-16 MED ORDER — FLUOCINONIDE 0.05 % EX SOLN: 1.0000 "application " | Freq: Two times a day (BID) | CUTANEOUS | 5 refills | Status: DC

## 2018-04-16 MED ORDER — PRAVASTATIN SODIUM 20 MG PO TABS: 20.0000 mg | ORAL_TABLET | Freq: Every day | ORAL | 1 refills | Status: DC

## 2018-04-16 MED ORDER — METFORMIN HCL 500 MG PO TABS: 500.0000 mg | ORAL_TABLET | Freq: Two times a day (BID) | ORAL | 1 refills | Status: DC

## 2018-04-16 NOTE — Progress Notes (Signed)
[facility]   Progress Note: General Provider: Mike GipAndre Margia Wiesen, FNP  SUBJECTIVE:   Barbara Lloyd is a 53 y.o. female who  has a past medical history of Hypertension.. Patient presents today for Hypertension; Hyperlipidemia; Diabetes; and Follow-up (Psoriasis needs some cream ) She reports compliance with all medications.Patient denies side effects of medications at the present time.  She continues to smoke half a pack a day.  Patient with a goal to stop smoking in the next few months.  She states that she is under stress right now due to losing a pet.  Having a psoriasis flare in the scalp.  Has used Lidex in the past with relief.  Requesting a refill on that medication today.   Review of Systems  Constitutional: Negative.   HENT: Negative.   Eyes: Negative.   Respiratory: Negative.   Cardiovascular: Negative.   Gastrointestinal: Negative.   Genitourinary: Negative.   Musculoskeletal: Negative.   Skin: Positive for rash (Scalp).  Neurological: Negative.   Psychiatric/Behavioral: Negative.      OBJECTIVE: BP 117/70 (BP Location: Right Arm, Patient Position: Sitting, Cuff Size: Normal)   Pulse 81   Temp 98 F (36.7 C) (Oral)   Resp 14   Ht 5\' 10"  (1.778 m)   Wt 162 lb (73.5 kg)   SpO2 100%   BMI 23.24 kg/m   Wt Readings from Last 3 Encounters:  04/16/18 162 lb (73.5 kg)  02/26/18 165 lb (74.8 kg)  02/19/18 164 lb (74.4 kg)     Physical Exam Vitals signs and nursing note reviewed.  Constitutional:      General: She is not in acute distress.    Appearance: She is well-developed.  HENT:     Head: Normocephalic and atraumatic.  Eyes:     Conjunctiva/sclera: Conjunctivae normal.     Pupils: Pupils are equal, round, and reactive to light.  Neck:     Musculoskeletal: Normal range of motion.  Cardiovascular:     Rate and Rhythm: Normal rate and regular rhythm.     Heart sounds: Normal heart sounds.  Pulmonary:   Effort: Pulmonary effort is normal. No respiratory distress.     Breath sounds: Normal breath sounds.  Abdominal:     General: Bowel sounds are normal. There is no distension.     Palpations: Abdomen is soft.  Musculoskeletal: Normal range of motion.  Skin:    General: Skin is warm and dry.     Findings: Rash (scalp. see pic) present.  Neurological:     Mental Status: She is alert and oriented to person, place, and time.  Psychiatric:        Behavior: Behavior normal.        Thought Content: Thought content normal.        ASSESSMENT/PLAN:   1. Essential hypertension No medication changes warranted at the present time.   - Urinalysis Dipstick - lisinopril (PRINIVIL,ZESTRIL) 10 MG tablet; Take 1 tablet (10 mg total) by mouth daily.  Dispense: 90 tablet; Refill: 1  2. Type 2 diabetes mellitus without complication, without long-term current use of insulin (HCC) A1C- 6.4. patient is at goal. No medication changes.  - HgB A1c - metFORMIN (GLUCOPHAGE) 500 MG tablet; Take 1 tablet (500 mg total) by mouth 2 (two) times daily with a meal.  Dispense: 180 tablet; Refill: 1 - Lipid panel - Basic metabolic panel - Hepatic function panel  3. Hyperlipidemia, unspecified hyperlipidemia type Pending labs. Will adjust medications accordingly.    -  pravastatin (PRAVACHOL) 10 MG tablet; Take 2 tablets (20 mg total) by mouth daily.  Dispense: 90 tablet; Refill: 1  4. Psoriasis - fluocinonide (LIDEX) 0.05 % external solution; Apply 1 application topically 2 (two) times daily.  Dispense: 60 mL; Refill: 5  5. Tobacco use disorder, continuous Smoking cessation instruction/counseling given:  counseled patient on the dangers of tobacco use, advised patient to stop smoking, and reviewed strategies to maximize success   6. Encounter for smoking cessation counseling Smoking cessation instruction/counseling given:  counseled patient on the dangers of tobacco use, advised patient to stop smoking, and  reviewed strategies to maximize success    Return in 6 months (on 10/15/2018) for DM.    The patient was given clear instructions to go to ER or return to medical center if symptoms do not improve, worsen or new problems develop. The patient verbalized understanding and agreed with plan of care.   Ms. Freda Jackson. Riley Lam, FNP-BC [facility] [address] [place], [place] [address] [phone]

## 2018-04-16 NOTE — Patient Instructions (Addendum)
Your a1c is 6.4. You are still at goal! I look forward to working with you.    Smoking Tobacco Information, Adult Smoking tobacco can be harmful to your health. Tobacco contains a poisonous (toxic), colorless chemical called nicotine. Nicotine is addictive. It changes the brain and can make it hard to stop smoking. Tobacco also has other toxic chemicals that can hurt your body and raise your risk of many cancers. How can smoking tobacco affect me? Smoking tobacco puts you at risk for:  Cancer. Smoking is most commonly associated with lung cancer, but can also lead to cancer in other parts of the body.  Chronic obstructive pulmonary disease (COPD). This is a long-term lung condition that makes it hard to breathe. It also gets worse over time.  High blood pressure (hypertension), heart disease, stroke, or heart attack.  Lung infections, such as pneumonia.  Cataracts. This is when the lenses in the eyes become clouded.  Digestive problems. This may include peptic ulcers, heartburn, and gastroesophageal reflux disease (GERD).  Oral health problems, such as gum disease and tooth loss.  Loss of taste and smell. Smoking can affect your appearance by causing:  Wrinkles.  Yellow or stained teeth, fingers, and fingernails. Smoking tobacco can also affect your social life, because:  It may be challenging to find places to smoke when away from home. Many workplaces, [employer], hotels, and public places are tobacco-free.  Smoking is expensive. This is due to the cost of tobacco and the long-term costs of treating health problems from smoking.  Secondhand smoke may affect those around you. Secondhand smoke can cause lung cancer, breathing problems, and heart disease. Children of smokers have a higher risk for: ? Sudden infant death syndrome (SIDS). ? Ear infections. ? Lung infections. If you currently smoke tobacco, quitting now can help you:  Lead a longer and healthier life.  Look,  smell, breathe, and feel better over time.  Save money.  Protect others from the harms of secondhand smoke. What actions can I take to prevent health problems? Quit smoking   Do not start smoking. Quit if you already do.  Make a plan to quit smoking and commit to it. Look for programs to help you and ask your health care provider for recommendations and ideas.  Set a date and write down all the reasons you want to quit.  Let your friends and family know you are quitting so they can help and support you. Consider finding friends who also want to quit. It can be easier to quit with someone else, so that you can support each other.  Talk with your health care provider about using nicotine replacement medicines to help you quit, such as gum, lozenges, patches, sprays, or pills.  Do not replace cigarette smoking with electronic cigarettes, which are commonly called e-cigarettes. The safety of e-cigarettes is not known, and some may contain harmful chemicals.  If you try to quit but return to smoking, stay positive. It is common to slip up when you first quit, so take it one day at a time.  Be prepared for cravings. When you feel the urge to smoke, chew gum or suck on hard candy. Lifestyle  Stay busy and take care of your body.  Drink enough fluid to keep your urine pale yellow.  Get plenty of exercise and eat a healthy diet. This can help prevent weight gain after quitting.  Monitor your eating habits. Quitting smoking can cause you to have a larger appetite than when  you smoke.  Find ways to relax. Go out with friends or family to a movie or a restaurant where people do not smoke.  Ask your health care provider about having regular tests (screenings) to check for cancer. This may include blood tests, imaging tests, and other tests.  Find ways to manage your stress, such as meditation, yoga, or exercise. Where to find support To get support to quit smoking, consider:  Asking your  health care provider for more information and resources.  Taking classes to learn more about quitting smoking.  Looking for local organizations that offer resources about quitting smoking.  Joining a support group for people who want to quit smoking in your local community.  Calling the smokefree.gov counselor helpline: 1-800-Quit-Now 2255290805) Where to find more information You may find more information about quitting smoking from:  HelpGuide.org: www.helpguide.org  [employer]: smokefree.gov  American Lung Association: www.lung.org Contact a health care provider if you:  Have problems breathing.  Notice that your lips, nose, or fingers turn blue.  Have chest pain.  Are coughing up blood.  Feel faint or you pass out.  Have other health changes that cause you to worry. Summary  Smoking tobacco can negatively affect your health, the health of those around you, your finances, and your social life.  Do not start smoking. Quit if you already do. If you need help quitting, ask your health care provider.  Think about joining a support group for people who want to quit smoking in your local community. There are many effective programs that will help you to quit this behavior. This information is not intended to replace advice given to you by your health care provider. Make sure you discuss any questions you have with your health care provider. Document Released: 03/20/2016 Document Revised: 04/24/2017 Document Reviewed: 03/20/2016 Elsevier Interactive Patient Education  2019 Elsevier Inc.   Diabetes Mellitus and Standards of Medical Care Managing diabetes (diabetes mellitus) can be complicated. Your diabetes treatment may be managed by a team of health care providers, including:  A physician who specializes in diabetes (endocrinologist).  A nurse practitioner or physician assistant.  Nurses.  A diet and nutrition specialist (registered dietitian).  A certified  diabetes educator (CDE).  An exercise specialist.  A pharmacist.  An eye doctor.  A foot specialist (podiatrist).  A dentist.  A primary care provider.  A mental health provider. Your health care providers follow guidelines to help you get the best quality of care. The following schedule is a general guideline for your diabetes management plan. Your health care providers may give you more specific instructions. Physical exams Upon being diagnosed with diabetes mellitus, and each year after that, your health care provider will ask about your medical and family history. He or she will also do a physical exam. Your exam may include:  Measuring your height, weight, and body mass index (BMI).  Checking your blood pressure. This will be done at every routine medical visit. Your target blood pressure may vary depending on your medical conditions, your age, and other factors.  Thyroid gland exam.  Skin exam.  Screening for damage to your nerves (peripheral neuropathy). This may include checking the pulse in your legs and feet and checking the level of sensation in your hands and feet.  A complete foot exam to inspect the structure and skin of your feet, including checking for cuts, bruises, redness, blisters, sores, or other problems.  Screening for blood vessel (vascular) problems, which may include checking the  pulse in your legs and feet and checking your temperature. Blood tests Depending on your treatment plan and your personal needs, you may have the following tests done:  HbA1c (hemoglobin A1c). This test provides information about blood sugar (glucose) control over the previous 2-3 months. It is used to adjust your treatment plan, if needed. This test will be done: ? At least 2 times a year, if you are meeting your treatment goals. ? 4 times a year, if you are not meeting your treatment goals or if treatment goals have changed.  Lipid testing, including total, LDL, and HDL  cholesterol and triglyceride levels. ? The goal for LDL is less than 100 mg/dL (5.5 mmol/L). If you are at high risk for complications, the goal is less than 70 mg/dL (3.9 mmol/L). ? The goal for HDL is 40 mg/dL (2.2 mmol/L) or higher for men and 50 mg/dL (2.8 mmol/L) or higher for women. An HDL cholesterol of 60 mg/dL (3.3 mmol/L) or higher gives some protection against heart disease. ? The goal for triglycerides is less than 150 mg/dL (8.3 mmol/L).  Liver function tests.  Kidney function tests.  Thyroid function tests. Dental and eye exams  Visit your dentist two times a year.  If you have type 1 diabetes, your health care provider may recommend an eye exam 3-5 years after you are diagnosed, and then once a year after your first exam. ? For children with type 1 diabetes, a health care provider may recommend an eye exam when your child is age 57 or older and has had diabetes for 3-5 years. After the first exam, your child should get an eye exam once a year.  If you have type 2 diabetes, your health care provider may recommend an eye exam as soon as you are diagnosed, and then once a year after your first exam. Immunizations   The yearly flu (influenza) vaccine is recommended for everyone 6 months or older who has diabetes.  The pneumonia (pneumococcal) vaccine is recommended for everyone 2 years or older who has diabetes. If you are 44 or older, you may get the pneumonia vaccine as a series of two separate shots.  The hepatitis B vaccine is recommended for adults shortly after being diagnosed with diabetes.  Adults and children with diabetes should receive all other vaccines according to age-specific recommendations from the Centers for Disease Control and Prevention (CDC). Mental and emotional health Screening for symptoms of eating disorders, anxiety, and depression is recommended at the time of diagnosis and afterward as needed. If your screening shows that you have symptoms  (positive screening result), you may need more evaluation and you may work with a mental health care provider. Treatment plan Your treatment plan will be reviewed at every medical visit. You and your health care provider will discuss:  How you are taking your medicines, including insulin.  Any side effects you are experiencing.  Your blood glucose target goals.  The frequency of your blood glucose monitoring.  Lifestyle habits, such as activity level as well as tobacco, alcohol, and substance use. Diabetes self-management education Your health care provider will assess how well you are monitoring your blood glucose levels and whether you are taking your insulin correctly. He or she may refer you to:  A certified diabetes educator to manage your diabetes throughout your life, starting at diagnosis.  A registered dietitian who can create or review your personal nutrition plan.  An exercise specialist who can discuss your activity level and exercise  plan. Summary  Managing diabetes (diabetes mellitus) can be complicated. Your diabetes treatment may be managed by a team of health care providers.  Your health care providers follow guidelines in order to help you get the best quality of care.  Standards of care including having regular physical exams, blood tests, blood pressure monitoring, immunizations, screening tests, and education about how to manage your diabetes.  Your health care providers may also give you more specific instructions based on your individual health. This information is not intended to replace advice given to you by your health care provider. Make sure you discuss any questions you have with your health care provider. Document Released: 12/31/2008 Document Revised: 11/22/2017 Document Reviewed: 12/02/2015 Elsevier Interactive Patient Education  2019 Elsevier Inc.   Diabetes Mellitus and Foot Care Foot care is an important part of your health, especially when you  have diabetes. Diabetes may cause you to have problems because of poor blood flow (circulation) to your feet and legs, which can cause your skin to:  Become thinner and drier.  Break more easily.  Heal more slowly.  Peel and crack. You may also have nerve damage (neuropathy) in your legs and feet, causing decreased feeling in them. This means that you may not notice minor injuries to your feet that could lead to more serious problems. Noticing and addressing any potential problems early is the best way to prevent future foot problems. How to care for your feet Foot hygiene  Wash your feet daily with warm water and mild soap. Do not use hot water. Then, pat your feet and the areas between your toes until they are completely dry. Do not soak your feet as this can dry your skin.  Trim your toenails straight across. Do not dig under them or around the cuticle. File the edges of your nails with an emery board or nail file.  Apply a moisturizing lotion or petroleum jelly to the skin on your feet and to dry, brittle toenails. Use lotion that does not contain alcohol and is unscented. Do not apply lotion between your toes. Shoes and socks  Wear clean socks or stockings every day. Make sure they are not too tight. Do not wear knee-high stockings since they may decrease blood flow to your legs.  Wear shoes that fit properly and have enough cushioning. Always look in your shoes before you put them on to be sure there are no objects inside.  To break in new shoes, wear them for just a few hours a day. This prevents injuries on your feet. Wounds, scrapes, corns, and calluses  Check your feet daily for blisters, cuts, bruises, sores, and redness. If you cannot see the bottom of your feet, use a mirror or ask someone for help.  Do not cut corns or calluses or try to remove them with medicine.  If you find a minor scrape, cut, or break in the skin on your feet, keep it and the skin around it clean and  dry. You may clean these areas with mild soap and water. Do not clean the area with peroxide, alcohol, or iodine.  If you have a wound, scrape, corn, or callus on your foot, look at it several times a day to make sure it is healing and not infected. Check for: ? Redness, swelling, or pain. ? Fluid or blood. ? Warmth. ? Pus or a bad smell. General instructions  Do not cross your legs. This may decrease blood flow to your feet.  Do  not use heating pads or hot water bottles on your feet. They may burn your skin. If you have lost feeling in your feet or legs, you may not know this is happening until it is too late.  Protect your feet from hot and cold by wearing shoes, such as at the beach or on hot pavement.  Schedule a complete foot exam at least once a year (annually) or more often if you have foot problems. If you have foot problems, report any cuts, sores, or bruises to your health care provider immediately. Contact a health care provider if:  You have a medical condition that increases your risk of infection and you have any cuts, sores, or bruises on your feet.  You have an injury that is not healing.  You have redness on your legs or feet.  You feel burning or tingling in your legs or feet.  You have pain or cramps in your legs and feet.  Your legs or feet are numb.  Your feet always feel cold.  You have pain around a toenail. Get help right away if:  You have a wound, scrape, corn, or callus on your foot and: ? You have pain, swelling, or redness that gets worse. ? You have fluid or blood coming from the wound, scrape, corn, or callus. ? Your wound, scrape, corn, or callus feels warm to the touch. ? You have pus or a bad smell coming from the wound, scrape, corn, or callus. ? You have a fever. ? You have a red line going up your leg. Summary  Check your feet every day for cuts, sores, red spots, swelling, and blisters.  Moisturize feet and legs daily.  Wear shoes  that fit properly and have enough cushioning.  If you have foot problems, report any cuts, sores, or bruises to your health care provider immediately.  Schedule a complete foot exam at least once a year (annually) or more often if you have foot problems. This information is not intended to replace advice given to you by your health care provider. Make sure you discuss any questions you have with your health care provider. Document Released: 03/02/2000 Document Revised: 04/17/2017 Document Reviewed: 04/06/2016 Elsevier Interactive Patient Education  2019 Elsevier Inc.  Diabetes Mellitus and Nutrition, Adult When you have diabetes (diabetes mellitus), it is very important to have healthy eating habits because your blood sugar (glucose) levels are greatly affected by what you eat and drink. Eating healthy foods in the appropriate amounts, at about the same times every day, can help you: Control your blood glucose. Lower your risk of heart disease. Improve your blood pressure. Reach or maintain a healthy weight. Every person with diabetes is different, and each person has different needs for a meal plan. Your health care provider may recommend that you work with a diet and nutrition specialist (dietitian) to make a meal plan that is best for you. Your meal plan may vary depending on factors such as: The calories you need. The medicines you take. Your weight. Your blood glucose, blood pressure, and cholesterol levels. Your activity level. Other health conditions you have, such as heart or kidney disease. How do carbohydrates affect me? Carbohydrates, also called carbs, affect your blood glucose level more than any other type of food. Eating carbs naturally raises the amount of glucose in your blood. Carb counting is a method for keeping track of how many carbs you eat. Counting carbs is important to keep your blood glucose at a healthy  level, especially if you use insulin or take certain oral  diabetes medicines. It is important to know how many carbs you can safely have in each meal. This is different for every person. Your dietitian can help you calculate how many carbs you should have at each meal and for each snack. Foods that contain carbs include: Bread, cereal, rice, pasta, and crackers. Potatoes and corn. Peas, beans, and lentils. Milk and yogurt. Fruit and juice. Desserts, such as cakes, cookies, ice cream, and candy. How does alcohol affect me? Alcohol can cause a sudden decrease in blood glucose (hypoglycemia), especially if you use insulin or take certain oral diabetes medicines. Hypoglycemia can be a life-threatening condition. Symptoms of hypoglycemia (sleepiness, dizziness, and confusion) are similar to symptoms of having too much alcohol. If your health care provider says that alcohol is safe for you, follow these guidelines: Limit alcohol intake to no more than 1 drink per day for nonpregnant women and 2 drinks per day for men. One drink equals 12 oz of beer, 5 oz of wine, or 1 oz of hard liquor. Do not drink on an empty stomach. Keep yourself hydrated with water, diet soda, or unsweetened iced tea. Keep in mind that regular soda, juice, and other mixers may contain a lot of sugar and must be counted as carbs. What are tips for following this plan?  Reading food labels Start by checking the serving size on the "Nutrition Facts" label of packaged foods and drinks. The amount of calories, carbs, fats, and other nutrients listed on the label is based on one serving of the item. Many items contain more than one serving per package. Check the total grams (g) of carbs in one serving. You can calculate the number of servings of carbs in one serving by dividing the total carbs by 15. For example, if a food has 30 g of total carbs, it would be equal to 2 servings of carbs. Check the number of grams (g) of saturated and trans fats in one serving. Choose foods that have low or  no amount of these fats. Check the number of milligrams (mg) of salt (sodium) in one serving. Most people should limit total sodium intake to less than 2,300 mg per day. Always check the nutrition information of foods labeled as "low-fat" or "nonfat". These foods may be higher in added sugar or refined carbs and should be avoided. Talk to your dietitian to identify your daily goals for nutrients listed on the label. Shopping Avoid buying canned, premade, or processed foods. These foods tend to be high in fat, sodium, and added sugar. Shop around the outside edge of the grocery store. This includes fresh fruits and vegetables, bulk grains, fresh meats, and fresh dairy. Cooking Use low-heat cooking methods, such as baking, instead of high-heat cooking methods like deep frying. Cook using healthy oils, such as olive, canola, or sunflower oil. Avoid cooking with butter, cream, or high-fat meats. Meal planning Eat meals and snacks regularly, preferably at the same times every day. Avoid going long periods of time without eating. Eat foods high in fiber, such as fresh fruits, vegetables, beans, and whole grains. Talk to your dietitian about how many servings of carbs you can eat at each meal. Eat 4-6 ounces (oz) of lean protein each day, such as lean meat, chicken, fish, eggs, or tofu. One oz of lean protein is equal to: 1 oz of meat, chicken, or fish. 1 egg.  cup of tofu. Eat some foods each day  that contain healthy fats, such as avocado, nuts, seeds, and fish. Lifestyle Check your blood glucose regularly. Exercise regularly as told by your health care provider. This may include: 150 minutes of moderate-intensity or vigorous-intensity exercise each week. This could be brisk walking, biking, or water aerobics. Stretching and doing strength exercises, such as yoga or weightlifting, at least 2 times a week. Take medicines as told by your health care provider. Do not use any products that contain  nicotine or tobacco, such as cigarettes and e-cigarettes. If you need help quitting, ask your health care provider. Work with a Veterinary surgeoncounselor or diabetes educator to identify strategies to manage stress and any emotional and social challenges. Questions to ask a health care provider Do I need to meet with a diabetes educator? Do I need to meet with a dietitian? What number can I call if I have questions? When are the best times to check my blood glucose? Where to find more information: American Diabetes Association: diabetes.org Academy of Nutrition and Dietetics: www.eatright.[employer]org National Institute of Diabetes and Digestive and Kidney Diseases (NIH): CarFlippers.tnwww.niddk.nih.gov Summary A healthy meal plan will help you control your blood glucose and maintain a healthy lifestyle. Working with a diet and nutrition specialist (dietitian) can help you make a meal plan that is best for you. Keep in mind that carbohydrates (carbs) and alcohol have immediate effects on your blood glucose levels. It is important to count carbs and to use alcohol carefully. This information is not intended to replace advice given to you by your health care provider. Make sure you discuss any questions you have with your health care provider. Document Released: 11/30/2004 Document Revised: 10/03/2016 Document Reviewed: 04/09/2016 Elsevier Interactive Patient Education  2019 [employer]

## 2018-04-17 ENCOUNTER — Other Ambulatory Visit: Payer: Self-pay | Admitting: Family Medicine

## 2018-04-17 DIAGNOSIS — L409 Psoriasis, unspecified: Secondary | ICD-10-CM

## 2018-04-17 LAB — BASIC METABOLIC PANEL
BUN/Creatinine Ratio: 19 (ref 9–23)
BUN: 15 mg/dL (ref 6–24)
CO2: 24 mmol/L (ref 20–29)
Calcium: 10.1 mg/dL (ref 8.7–10.2)
Chloride: 98 mmol/L (ref 96–106)
Creatinine, Ser: 0.77 mg/dL (ref 0.57–1.00)
GFR calc Af Amer: 103 mL/min/{1.73_m2} (ref >59)
GFR calc non Af Amer: 89 mL/min/{1.73_m2} (ref >59)
Glucose: 114 mg/dL — ABNORMAL HIGH (ref 65–99)
Potassium: 4.4 mmol/L (ref 3.5–5.2)
Sodium: 137 mmol/L (ref 134–144)

## 2018-04-17 LAB — HEPATIC FUNCTION PANEL
ALT: 15 IU/L (ref 0–32)
AST: 13 IU/L (ref 0–40)
Albumin: 4.7 g/dL (ref 3.8–4.9)
Alkaline Phosphatase: 82 IU/L (ref 39–117)
Bilirubin Total: 0.7 mg/dL (ref 0.0–1.2)
Bilirubin, Direct: 0.14 mg/dL (ref 0.00–0.40)
Total Protein: 7.4 g/dL (ref 6.0–8.5)

## 2018-04-17 LAB — LIPID PANEL
Chol/HDL Ratio: 4.7 ratio — ABNORMAL HIGH (ref 0.0–4.4)
Cholesterol, Total: 185 mg/dL (ref 100–199)
HDL: 39 mg/dL — ABNORMAL LOW (ref >39)
LDL Calculated: 116 mg/dL — ABNORMAL HIGH (ref 0–99)
Triglycerides: 149 mg/dL (ref 0–149)
VLDL Cholesterol Cal: 30 mg/dL (ref 5–40)

## 2018-04-18 ENCOUNTER — Other Ambulatory Visit: Payer: Self-pay | Admitting: Family Medicine

## 2018-04-18 NOTE — Telephone Encounter (Signed)
Barbara Lloyd, Pharmacy is saying this is on backorder and is requesting an alternative. Please advise

## 2018-04-21 ENCOUNTER — Telehealth: Payer: Self-pay

## 2018-04-22 NOTE — Telephone Encounter (Signed)
Called, no answer. Left a message for patient to call back. Thanks!  

## 2018-04-23 ENCOUNTER — Ambulatory Visit: Payer: Self-pay | Admitting: Family Medicine

## 2018-05-04 ENCOUNTER — Other Ambulatory Visit: Payer: Self-pay

## 2018-05-04 ENCOUNTER — Emergency Department
Admission: EM | Admit: 2018-05-04 | Discharge: 2018-05-04 | Disposition: A | Discharge status: Home / Self Care | Payer: Self-pay | Source: Home / Self Care | Attending: Family Medicine | Admitting: Family Medicine

## 2018-05-04 DIAGNOSIS — B9789 Other viral agents as the cause of diseases classified elsewhere: Secondary | ICD-10-CM

## 2018-05-04 DIAGNOSIS — J069 Acute upper respiratory infection, unspecified: Secondary | ICD-10-CM

## 2018-05-04 DIAGNOSIS — J9801 Acute bronchospasm: Secondary | ICD-10-CM

## 2018-05-04 DIAGNOSIS — J029 Acute pharyngitis, unspecified: Secondary | ICD-10-CM

## 2018-05-04 MED ORDER — PREDNISONE 20 MG PO TABS: ORAL_TABLET | ORAL | 0 refills | Status: DC

## 2018-05-04 MED ORDER — AZITHROMYCIN 250 MG PO TABS: ORAL_TABLET | ORAL | 0 refills | Status: DC

## 2018-05-04 MED ORDER — GUAIFENESIN-CODEINE 100-10 MG/5ML PO SOLN: ORAL | 0 refills | Status: DC

## 2018-05-04 NOTE — ED Provider Notes (Signed)
[facility]    CSN: [id number] Arrival date & time: 05/04/18  1353     History   Chief Complaint Chief Complaint  Patient presents with  . Cough  . Nasal Congestion    HPI Barbara Lloyd is a 53 y.o. female.   Patient complains of one week history of typical cold-like symptoms developing over several days, including mild sore throat, sinus congestion, headache, fatigue, and cough.  She has developed intermittent wheezing.  She denies pleuritic pain. She has seasonal rhinitis (worse springtime).  She continues to smoke.  She has had pneumonia in the past.  The history is provided by the patient.    Past Medical History:  Diagnosis Date  . Hypertension     Patient Active Problem List   Diagnosis Date Noted  . Hyperlipidemia 03/28/2016  . Type 2 diabetes mellitus without complication, without long-term current use of insulin (HCC) 03/27/2016  . Essential hypertension 08/16/2015  . Dental caries 08/16/2015  . BP (high blood pressure) 08/16/2015    Past Surgical History:  Procedure Laterality Date  . CESAREAN SECTION     x2  . ENDOMETRIAL ABLATION     2006  . FOOT SURGERY    . GANGLION CYST EXCISION      OB History   No obstetric history on file.      Home Medications    Prior to Admission medications   Medication Sig Start Date End Date Taking? Authorizing Provider  pravastatin (PRAVACHOL) 20 MG tablet Take 20 mg by mouth daily.   Yes [provider]  azithromycin (ZITHROMAX Z-PAK) 250 MG tablet Take 2 tabs today; then begin one tab once daily for 4 more days. 05/04/18   Lattie HawBeese, Cybele Maule A, MD  fluocinolone (SYNALAR) 0.01 % external solution Please specify directions, refills and quantity 04/18/18   Mike Gipouglas, Andre, FNP  guaiFENesin-codeine 100-10 MG/5ML syrup Take 10mL by mouth at bedtime as needed for cough.  May repeat dose in 4 to 6 hours. 05/04/18   Lattie HawBeese, Dresean Beckel A, MD  lisinopril (PRINIVIL,ZESTRIL) 10 MG tablet Take 1 tablet (10 mg  total) by mouth daily. 04/16/18   Mike Gipouglas, Andre, FNP  metFORMIN (GLUCOPHAGE) 500 MG tablet Take 1 tablet (500 mg total) by mouth 2 (two) times daily with a meal. 04/16/18   Mike Gipouglas, Andre, FNP  predniSONE (DELTASONE) 20 MG tablet Take one tab by mouth twice daily for 4 days, then one daily. Take with food. 05/04/18   Lattie HawBeese, Glenn Christo A, MD  pravastatin (PRAVACHOL) 20 MG tablet Take 1 tablet (20 mg total) by mouth daily. 04/16/18   Mike Gipouglas, Andre, FNP    Family History Family History  Adopted: Yes    Social History Social History   Tobacco Use  . Smoking status: Current Every Day Smoker    Packs/day: 0.50    Types: Cigarettes  . Smokeless tobacco: Never Used  Substance Use Topics  . Alcohol use: Yes    Comment: occ  . Drug use: No     Allergies   Ciprofloxacin and Sulfa antibiotics   Review of Systems Review of Systems + sore throat + cough No pleuritic pain + wheezing + nasal congestion No itchy/red eyes No earache No hemoptysis No SOB No fever, + chills No nausea No vomiting No abdominal pain No diarrhea No urinary symptoms No skin rash + fatigue No myalgias + headache    Physical Exam Triage Vital Signs ED Triage Vitals  Enc Vitals Group     BP  Pulse      Resp      Temp      Temp src      SpO2      Weight      Height      Head Circumference      Peak Flow      Pain Score      Pain Loc      Pain Edu?      Excl. in [place]?    No data found.  Updated Vital Signs BP 137/77 (BP Location: Right Arm)   Pulse 83   Temp 98.3 F (36.8 C) (Oral)   Ht 5\' 10"  (1.778 m)   Wt 73.5 kg   LMP  (LMP Unknown)   SpO2 99%   BMI 23.24 kg/m   Visual Acuity Right Eye Distance:   Left Eye Distance:   Bilateral Distance:    Right Eye Near:   Left Eye Near:    Bilateral Near:     Physical Exam Nursing notes and Vital Signs reviewed. Appearance:  Patient appears stated age, and in no acute distress Eyes:  Pupils are equal, round, and reactive to  light and accomodation.  Extraocular movement is intact.  Conjunctivae are not inflamed  Ears:  Canals normal.  Tympanic membranes normal.  Nose:  Mildly congested turbinates.  No sinus tenderness.  Pharynx:  Normal Neck:  Supple.  Enlarged posterior/lateral nodes are palpated bilaterally, tender to palpation on the left.   Lungs:  Scattered faint expiratory wheezes.  Breath sounds are equal.  Moving air well. Heart:  Regular rate and rhythm without murmurs, rubs, or gallops.  Abdomen:  Nontender without masses or hepatosplenomegaly.  Bowel sounds are present.  No CVA or flank tenderness.  Extremities:  No edema.  Skin:  No rash present.     UC Treatments / Results  Labs (all labs ordered are listed, but only abnormal results are displayed) Labs Reviewed - No data to display  EKG None  Radiology No results found.  Procedures Procedures (including critical care time)  Medications Ordered in UC Medications - No data to display  Initial Impression / Assessment and Plan / UC Course  I have reviewed the triage vital signs and the nursing notes.  Pertinent labs & imaging results that were available during my care of the patient were reviewed by me and considered in my medical decision making (see chart for details).    Suspect mild reactive airways disease.  Begin prednisone burst/taper and Z-pak. Rx for Robitussin AC for night time cough.  Controlled Substance Prescriptions I have consulted the [place] Controlled Substances Registry for this patient, and feel the risk/benefit ratio today is favorable for proceeding with this prescription for a controlled substance.   Followup with Family Doctor if not improved in 10 days.   Final Clinical Impressions(s) / UC Diagnoses   Final diagnoses:  Acute pharyngitis, unspecified etiology  Viral URI with cough  Bronchospasm, acute     Discharge Instructions     Take plain guaifenesin (1200mg  extended release tabs such as Mucinex) twice  daily, with plenty of water, for cough and congestion.  Get adequate rest.   May use Afrin nasal spray (or generic oxymetazoline) each morning for about 5 days and then discontinue.  Also recommend using saline nasal spray several times daily and saline nasal irrigation (AYR is a common brand).  Use Flonase nasal spray each morning after using Afrin nasal spray and saline nasal irrigation. Try warm salt  water gargles for sore throat.  Stop all antihistamines for now, and other non-prescription cough/cold preparations.        ED Prescriptions    Medication Sig Dispense Auth. Provider   predniSONE (DELTASONE) 20 MG tablet Take one tab by mouth twice daily for 4 days, then one daily. Take with food. 12 tablet Lattie Haw, MD   azithromycin (ZITHROMAX Z-PAK) 250 MG tablet Take 2 tabs today; then begin one tab once daily for 4 more days. 6 tablet Lattie Haw, MD   guaiFENesin-codeine 100-10 MG/5ML syrup Take 50mL by mouth at bedtime as needed for cough.  May repeat dose in 4 to 6 hours. 100 mL Lattie Haw, MD         Lattie Haw, MD 05/04/18 [phone]

## 2018-05-04 NOTE — Discharge Instructions (Addendum)
Take plain guaifenesin (1200mg extended release tabs such as Mucinex) twice daily, with plenty of water, for cough and congestion.  Get adequate rest.   °May use Afrin nasal spray (or generic oxymetazoline) each morning for about 5 days and then discontinue.  Also recommend using saline nasal spray several times daily and saline nasal irrigation (AYR is a common brand).  Use Flonase nasal spray each morning after using Afrin nasal spray and saline nasal irrigation. °Try warm salt water gargles for sore throat.  °Stop all antihistamines for now, and other non-prescription cough/cold preparations. °  °  °

## 2018-05-04 NOTE — ED Triage Notes (Signed)
Pt c/o cold sxs since Monday. Productive cough, nasal congestion and runny nose. Green mucous. Taking OTC meds to tx symptoms.

## 2018-07-15 NOTE — Telephone Encounter (Signed)
Message sent to provider 

## 2018-09-25 ENCOUNTER — Other Ambulatory Visit: Payer: Self-pay | Admitting: Family Medicine

## 2018-09-25 DIAGNOSIS — E785 Hyperlipidemia, unspecified: Secondary | ICD-10-CM

## 2018-10-08 ENCOUNTER — Ambulatory Visit: Payer: Self-pay | Admitting: Family Medicine

## 2018-11-11 ENCOUNTER — Other Ambulatory Visit: Payer: Self-pay | Admitting: Family Medicine

## 2018-11-11 DIAGNOSIS — I10 Essential (primary) hypertension: Secondary | ICD-10-CM

## 2018-11-11 DIAGNOSIS — E119 Type 2 diabetes mellitus without complications: Secondary | ICD-10-CM

## 2018-11-11 NOTE — Telephone Encounter (Signed)
Called l/m about refill request that we have receive from the pharmacy , pt is also do for an appt. L/m for patient to call back to set up an appt. Then we can send a refill of her medication.

## 2018-11-26 ENCOUNTER — Encounter (HOSPITAL_COMMUNITY): Payer: Self-pay | Admitting: *Deleted

## 2018-11-26 ENCOUNTER — Encounter (HOSPITAL_COMMUNITY): Payer: Self-pay

## 2019-02-08 ENCOUNTER — Emergency Department (INDEPENDENT_AMBULATORY_CARE_PROVIDER_SITE_OTHER): Payer: Self-pay

## 2019-02-08 ENCOUNTER — Other Ambulatory Visit: Payer: Self-pay

## 2019-02-08 ENCOUNTER — Emergency Department
Admission: EM | Admit: 2019-02-08 | Discharge: 2019-02-08 | Disposition: A | Discharge status: Home / Self Care | Payer: Self-pay | Source: Home / Self Care

## 2019-02-08 DIAGNOSIS — J069 Acute upper respiratory infection, unspecified: Secondary | ICD-10-CM

## 2019-02-08 DIAGNOSIS — R053 Chronic cough: Secondary | ICD-10-CM

## 2019-02-08 DIAGNOSIS — R05 Cough: Secondary | ICD-10-CM

## 2019-02-08 DIAGNOSIS — M19072 Primary osteoarthritis, left ankle and foot: Secondary | ICD-10-CM

## 2019-02-08 DIAGNOSIS — S92355A Nondisplaced fracture of fifth metatarsal bone, left foot, initial encounter for closed fracture: Secondary | ICD-10-CM

## 2019-02-08 DIAGNOSIS — L409 Psoriasis, unspecified: Secondary | ICD-10-CM

## 2019-02-08 DIAGNOSIS — Z760 Encounter for issue of repeat prescription: Secondary | ICD-10-CM

## 2019-02-08 MED ORDER — AMOXICILLIN-POT CLAVULANATE 875-125 MG PO TABS: 1.0000 | ORAL_TABLET | Freq: Two times a day (BID) | ORAL | 0 refills | Status: DC

## 2019-02-08 MED ORDER — FLUOCINOLONE ACETONIDE 0.01 % EX SOLN: Freq: Two times a day (BID) | CUTANEOUS | 0 refills | Status: DC

## 2019-02-08 NOTE — ED Triage Notes (Addendum)
Pt here today for "wet" cough x 1 mos. Some green mucous associated with cough. Denies fever. COVID neg on 10/28. Also requesting refill of psoriasis cream. Also c/o LT little toe pain after stubbing it a week ago. Increasing pain over last couple days, red and swollen.

## 2019-02-08 NOTE — Discharge Instructions (Signed)
°  Please follow up with sports medicine later this week as well as family medicine for cough if not improving.

## 2019-02-08 NOTE — ED Provider Notes (Signed)
[facility]    CSN: [id number] Arrival date & time: 02/08/19  1504      History   Chief Complaint Chief Complaint  Patient presents with  . Cough  . Toe Pain    LT foot, little toe    HPI Barbara SoKaren Lloyd is a 53 y.o. female.   HPI  Barbara Lloyd is a 53 y.o. female presenting to UC with c/o a wet cough for about 1 month, associated green nasal discharge and phlegm.  She had a negative Covid test on 10/28.  Mild chest soreness from the cough. No known sick contacts. Denies fever, chills, n/v/d.  Pt also c/o 1 week of Left little toe pain and swelling after stubbing it the other day.  She has applied ice and buddy taped her toes but only mild relief.  Pt believes she broke her toe.    Pt also requesting a refill on her psoriasis medication that she uses twice daily.      Past Medical History:  Diagnosis Date  . Hypertension     Patient Active Problem List   Diagnosis Date Noted  . Hyperlipidemia 03/28/2016  . Type 2 diabetes mellitus without complication, without long-term current use of insulin (HCC) 03/27/2016  . Essential hypertension 08/16/2015  . Dental caries 08/16/2015  . BP (high blood pressure) 08/16/2015    Past Surgical History:  Procedure Laterality Date  . CESAREAN SECTION     x2  . ENDOMETRIAL ABLATION     2006  . FOOT SURGERY    . GANGLION CYST EXCISION      OB History   No obstetric history on file.      Home Medications    Prior to Admission medications   Medication Sig Start Date End Date Taking? Authorizing Provider  amoxicillin-clavulanate (AUGMENTIN) 875-125 MG tablet Take 1 tablet by mouth 2 (two) times daily. One po bid x 7 days 02/08/19   Lurene ShadowPhelps, Kamee Bobst O, PA-C  fluocinolone (SYNALAR) 0.01 % external solution Apply topically 2 (two) times daily. 02/08/19   Lurene ShadowPhelps, Jearld Hemp O, PA-C  lisinopril (ZESTRIL) 10 MG tablet TAKE 1 TABLET BY MOUTH EVERY DAY 11/12/18   Mike Gipouglas, Andre, FNP  metFORMIN (GLUCOPHAGE) 500 MG tablet TAKE  1 TABLET (500 MG TOTAL) BY MOUTH 2 (TWO) TIMES DAILY WITH A MEAL. 11/12/18   Mike Gipouglas, Andre, FNP  pravastatin (PRAVACHOL) 20 MG tablet Take 20 mg by mouth daily.    [provider]  predniSONE (DELTASONE) 20 MG tablet Take one tab by mouth twice daily for 4 days, then one daily. Take with food. 05/04/18   Lattie HawBeese, Stephen A, MD    Family History Family History  Adopted: Yes    Social History Social History   Tobacco Use  . Smoking status: Current Every Day Smoker    Packs/day: 0.50    Types: Cigarettes  . Smokeless tobacco: Never Used  Substance Use Topics  . Alcohol use: Yes    Comment: occ  . Drug use: No     Allergies   Ciprofloxacin and Sulfa antibiotics   Review of Systems Review of Systems  Constitutional: Negative for chills and fever.  HENT: Positive for congestion and sinus pressure. Negative for ear pain, sinus pain, sore throat, trouble swallowing and voice change.   Respiratory: Positive for cough. Negative for shortness of breath.   Cardiovascular: Negative for chest pain and palpitations.  Gastrointestinal: Negative for abdominal pain, diarrhea, nausea and vomiting.  Musculoskeletal: Positive for arthralgias, gait problem and myalgias.  Negative for back pain.  Skin: Negative for rash.  Neurological: Positive for headaches (frontal). Negative for dizziness and light-headedness.     Physical Exam Triage Vital Signs ED Triage Vitals  Enc Vitals Group     BP 02/08/19 1536 119/78     Pulse Rate 02/08/19 1536 75     Resp 02/08/19 1536 18     Temp 02/08/19 1536 98.4 F (36.9 C)     Temp Source 02/08/19 1536 Oral     SpO2 02/08/19 1536 98 %     Weight 02/08/19 1537 162 lb (73.5 kg)     Height 02/08/19 1537 5\' 10"  (1.778 m)     Head Circumference --      Peak Flow --      Pain Score 02/08/19 1537 0     Pain Loc --      Pain Edu? --      Excl. in [place]? --    No data found.  Updated Vital Signs BP 119/78 (BP Location: Right Arm)   Pulse 75    Temp 98.4 F (36.9 C) (Oral)   Resp 18   Ht 5\' 10"  (1.778 m)   Wt 162 lb (73.5 kg)   SpO2 98%   BMI 23.24 kg/m   Visual Acuity Right Eye Distance:   Left Eye Distance:   Bilateral Distance:    Right Eye Near:   Left Eye Near:    Bilateral Near:     Physical Exam Vitals signs and nursing note reviewed.  Constitutional:      General: She is not in acute distress.    Appearance: Normal appearance. She is well-developed. She is not ill-appearing, toxic-appearing or diaphoretic.  HENT:     Head: Normocephalic and atraumatic.     Right Ear: Tympanic membrane and ear canal normal.     Left Ear: Tympanic membrane and ear canal normal.     Nose: Congestion present.     Right Sinus: No maxillary sinus tenderness or frontal sinus tenderness.     Left Sinus: No maxillary sinus tenderness or frontal sinus tenderness.     Mouth/Throat:     Lips: Pink.     Mouth: Mucous membranes are moist.     Pharynx: Oropharynx is clear. Uvula midline. Posterior oropharyngeal erythema present.  Neck:     Musculoskeletal: Normal range of motion.  Cardiovascular:     Rate and Rhythm: Normal rate and regular rhythm.  Pulmonary:     Effort: Pulmonary effort is normal. No respiratory distress.     Breath sounds: No stridor. Rhonchi (mild, diffuse) present. No wheezing or rales.  Musculoskeletal: Normal range of motion.        General: Swelling and tenderness present.       Feet:  Skin:    General: Skin is warm and dry.  Neurological:     Mental Status: She is alert and oriented to person, place, and time.  Psychiatric:        Behavior: Behavior normal.      UC Treatments / Results  Labs (all labs ordered are listed, but only abnormal results are displayed) Labs Reviewed - No data to display  EKG   Radiology Dg Foot Complete Left  Result Date: 02/08/2019 CLINICAL DATA:  Left fifth toe pain and swelling after injury 1 week prior EXAM: LEFT FOOT - COMPLETE 3+ VIEW COMPARISON:  None.  FINDINGS: Nondisplaced non articular fracture of the proximal aspect of proximal phalanx left fifth toe. No additional fractures.  No dislocation. No suspicious focal osseous lesions. Two metallic pins in the distal aspect of the first metatarsal. Moderate first MTP joint osteoarthritis. IMPRESSION: 1. Nondisplaced non articular fracture of the proximal phalanx left fifth toe. 2. Moderate first MTP joint osteoarthritis. Electronically Signed   By: Delbert Phenix M.D.   On: 02/08/2019 16:17    Procedures Procedures (including critical care time)  Medications Ordered in UC Medications - No data to display  Initial Impression / Assessment and Plan / UC Course  I have reviewed the triage vital signs and the nursing notes.  Pertinent labs & imaging results that were available during my care of the patient were reviewed by me and considered in my medical decision making (see chart for details).     Will tx for bacterial URI given duration of worsening symptoms. Pt declined repeat Covid testing.  Offered crutches and post-op shoe for Left foot fracture. Pt declined stating she has both at home.  Medication refilled  F/u with PCP and Sports Medicine as needed.   Final Clinical Impressions(s) / UC Diagnoses   Final diagnoses:  Upper respiratory tract infection, unspecified type  Persistent cough  Closed nondisplaced fracture of fifth metatarsal bone of left foot, initial encounter  Medication refill     Discharge Instructions      Please follow up with sports medicine later this week as well as family medicine for cough if not improving.    ED Prescriptions    Medication Sig Dispense Auth. Provider   amoxicillin-clavulanate (AUGMENTIN) 875-125 MG tablet Take 1 tablet by mouth 2 (two) times daily. One po bid x 7 days 14 tablet Neill Jurewicz O, PA-C   fluocinolone (SYNALAR) 0.01 % external solution Apply topically 2 (two) times daily. 60 mL Lurene Shadow, [place]     I have reviewed  the PDMP during this encounter.   Lurene Shadow, [place] 02/09/19 1207

## 2019-02-09 ENCOUNTER — Other Ambulatory Visit: Payer: Self-pay | Admitting: Family Medicine

## 2019-02-09 DIAGNOSIS — E785 Hyperlipidemia, unspecified: Secondary | ICD-10-CM

## 2019-03-31 ENCOUNTER — Ambulatory Visit (INDEPENDENT_AMBULATORY_CARE_PROVIDER_SITE_OTHER): Payer: Self-pay | Admitting: Osteopathic Medicine

## 2019-03-31 ENCOUNTER — Other Ambulatory Visit: Payer: Self-pay

## 2019-03-31 ENCOUNTER — Encounter: Payer: Self-pay | Admitting: Osteopathic Medicine

## 2019-03-31 VITALS — BP 124/70 | HR 75 | Temp 98.3°F | Ht 70.0 in | Wt 164.0 lb

## 2019-03-31 DIAGNOSIS — Z9851 Tubal ligation status: Secondary | ICD-10-CM

## 2019-03-31 DIAGNOSIS — E785 Hyperlipidemia, unspecified: Secondary | ICD-10-CM

## 2019-03-31 DIAGNOSIS — Z2821 Immunization not carried out because of patient refusal: Secondary | ICD-10-CM | POA: Insufficient documentation

## 2019-03-31 DIAGNOSIS — Z9889 Other specified postprocedural states: Secondary | ICD-10-CM | POA: Insufficient documentation

## 2019-03-31 DIAGNOSIS — I10 Essential (primary) hypertension: Secondary | ICD-10-CM

## 2019-03-31 DIAGNOSIS — E119 Type 2 diabetes mellitus without complications: Secondary | ICD-10-CM

## 2019-03-31 DIAGNOSIS — Z0282 Encounter for adoption services: Secondary | ICD-10-CM | POA: Insufficient documentation

## 2019-03-31 HISTORY — DX: Tubal ligation status: Z98.51

## 2019-03-31 HISTORY — DX: Other specified postprocedural states: Z98.890

## 2019-03-31 MED ORDER — PRAVASTATIN SODIUM 20 MG PO TABS: 20.0000 mg | ORAL_TABLET | Freq: Every day | ORAL | 3 refills | Status: DC

## 2019-03-31 MED ORDER — METFORMIN HCL 1000 MG PO TABS: 1000.0000 mg | ORAL_TABLET | Freq: Every day | ORAL | 3 refills | Status: DC

## 2019-03-31 MED ORDER — LISINOPRIL 10 MG PO TABS: 10.0000 mg | ORAL_TABLET | Freq: Every day | ORAL | 3 refills | Status: DC

## 2019-03-31 MED ORDER — TRAZODONE HCL 100 MG PO TABS: 50.0000 mg | ORAL_TABLET | Freq: Every evening | ORAL | 3 refills | Status: DC | PRN

## 2019-03-31 NOTE — Patient Instructions (Signed)
Homework: Health dept for Pap / Mammogram  +/- colon cancer screening next time  Vaping is ok

## 2019-03-31 NOTE — Progress Notes (Signed)
HPI: Barbara Lloyd is a 54 y.o. female who  has a past medical history of Diabetes (HCC), High cholesterol, and Hypertension.  she presents to [facility] today, 03/31/19,  for chief complaint of: New to establish care: hypertension, diabetes, high cholesterol, insomnia  Hypertension: Well-controlled on lisinopril, no history of cough or other adverse reactions to this medicine  Diabetes: A1c last checked was maybe 6 months ago, takes Metformin 500 mg twice daily but often will forget the p.m. dose  High cholesterol: Due for checkup with labs, patient reports well-controlled on pravastatin  Insomnia: Ongoing for the past 6 months or so and seems to be getting worse.  Thinks might have something to do now with working from home, lack of exercise, less social interaction.  She took one of her daughters trazodone, she thinks 100 mg, and this helped a great deal.  Benadryl has not been helpful    Past medical, surgical, social and family history reviewed:  Patient Active Problem List   Diagnosis Date Noted  . History of tubal ligation 03/31/2019  . History of endometrial ablation 03/31/2019  . Adopted 03/31/2019  . Influenza vaccination declined 03/31/2019  . Hyperlipidemia 03/28/2016  . Type 2 diabetes mellitus without complication, without long-term current use of insulin (HCC) 03/27/2016  . Essential hypertension 08/16/2015  . Dental caries 08/16/2015  . BP (high blood pressure) 08/16/2015    Past Surgical History:  Procedure Laterality Date  . CESAREAN SECTION     x2  . ENDOMETRIAL ABLATION     2006  . FOOT SURGERY    . GANGLION CYST EXCISION      Social History   Tobacco Use  . Smoking status: Current Every Day Smoker    Packs/day: 0.50    Years: 25.00    Pack years: 12.50    Types: Cigarettes  . Smokeless tobacco: Never Used  Substance Use Topics  . Alcohol use: Yes    Alcohol/week: 3.0 standard drinks    Types: 3 Standard  drinks or equivalent per week    Comment: occ    Family History  Adopted: Yes     Current medication list and allergy/intolerance information reviewed:    Current Outpatient Medications  Medication Sig Dispense Refill  . lisinopril (ZESTRIL) 10 MG tablet Take 1 tablet (10 mg total) by mouth daily. 90 tablet 3  . metFORMIN (GLUCOPHAGE) 1000 MG tablet Take 1 tablet (1,000 mg total) by mouth daily with breakfast. 90 tablet 3  . pravastatin (PRAVACHOL) 20 MG tablet Take 1 tablet (20 mg total) by mouth daily. 90 tablet 3  . traZODone (DESYREL) 100 MG tablet Take 0.5-1 tablets (50-100 mg total) by mouth at bedtime as needed for sleep. 90 tablet 3   No current facility-administered medications for this visit.    Allergies  Allergen Reactions  . Ciprofloxacin Rash  . Sulfa Antibiotics Rash      Review of Systems:  Constitutional:  No  fever, no chills, No recent illness, No unintentional weight changes. No significant fatigue.   HEENT: No  headache, no vision change, no hearing change, No sore throat, No  sinus pressure  Cardiac: No  chest pain, No  pressure, No palpitations, No  Orthopnea  Respiratory:  No  shortness of breath. No  Cough  Gastrointestinal: No  abdominal pain, No  nausea, No  vomiting,  No  blood in stool, No  diarrhea, No  constipation   Musculoskeletal: No new myalgia/arthralgia  Skin: No  Rash, No other wounds/concerning lesions  Genitourinary: No  incontinence, No  abnormal genital bleeding, No abnormal genital discharge  Hem/Onc: No  easy bruising/bleeding, No  abnormal lymph node  Endocrine: No cold intolerance,  No heat intolerance. No polyuria/polydipsia/polyphagia   Neurologic: No  weakness, No  dizziness, No  slurred speech/focal weakness/facial droop  Psychiatric: No  concerns with depression, No  concerns with anxiety, +sleep problems, No mood problems  Exam:  BP 124/70 (BP Location: Left Arm, Patient Position: Sitting, Cuff Size: Normal)    Pulse 75   Temp 98.3 F (36.8 C) (Oral)   Ht 5\' 10"  (1.778 m)   Wt 164 lb (74.4 kg)   BMI 23.53 kg/m   Constitutional: VS see above. General Appearance: alert, well-developed, well-nourished, NAD  Eyes: Normal lids and conjunctive, non-icteric sclera  Neck: No masses, trachea midline. No thyroid enlargement. No tenderness/mass appreciated. No lymphadenopathy  Respiratory: Normal respiratory effort. no wheeze, no rhonchi, no rales  Cardiovascular: S1/S2 normal, no murmur, no rub/gallop auscultated. RRR.   Gastrointestinal: Nontender, no masses. No hepatomegaly, no splenomegaly. No hernia appreciated. Bowel sounds normal. Rectal exam deferred.   Musculoskeletal: Gait normal. No clubbing/cyanosis of digits.   Neurological: Normal balance/coordination. No tremor.   Skin: warm, dry, intact. No rash/ulcer.   Psychiatric: Normal judgment/insight. Normal mood and affect. Oriented x3.    No results found for this or any previous visit (from the past 72 hour(s)).  No results found.     ASSESSMENT/PLAN: The primary encounter diagnosis was Essential hypertension. Diagnoses of Type 2 diabetes mellitus without complication, without long-term current use of insulin (HCC), Hyperlipidemia, unspecified hyperlipidemia type, History of tubal ligation, History of endometrial ablation, Adopted, and Influenza vaccination declined were also pertinent to this visit.   Unfortunately, self-pay status precludes some testing I would ideally like to get her caught up on particularly colon cancer screening, breast cancer screening, cervical cancer screening.  Good news is, she is working on quitting smoking so hopefully will be able to space Paps out after next one.  See patient instructions  Orders Placed This Encounter  Procedures  . CBC  . COMPLETE METABOLIC PANEL WITH GFR  . LIPID SCREENING  . A1C    Meds ordered this encounter  Medications  . metFORMIN (GLUCOPHAGE) 1000 MG tablet    Sig:  Take 1 tablet (1,000 mg total) by mouth daily with breakfast.    Dispense:  90 tablet    Refill:  3  . lisinopril (ZESTRIL) 10 MG tablet    Sig: Take 1 tablet (10 mg total) by mouth daily.    Dispense:  90 tablet    Refill:  3  . pravastatin (PRAVACHOL) 20 MG tablet    Sig: Take 1 tablet (20 mg total) by mouth daily.    Dispense:  90 tablet    Refill:  3  . traZODone (DESYREL) 100 MG tablet    Sig: Take 0.5-1 tablets (50-100 mg total) by mouth at bedtime as needed for sleep.    Dispense:  90 tablet    Refill:  3    Patient Instructions  Homework: Health dept for Pap / Mammogram  +/- colon cancer screening next time  Vaping is ok         Visit summary with medication list and pertinent instructions was printed for patient to review. All questions at time of visit were answered - patient instructed to contact office with any additional concerns or updates. ER/RTC precautions were reviewed with the patient.  Note: Total time spent 30 minutes, greater than 50% of the visit was spent face-to-face counseling and coordinating care for the above diagnoses listed in assessment/plan.   Please note: voice recognition software was used to produce this document, and typos may escape review. Please contact Dr. Sheppard Coil for any needed clarifications.     Follow-up plan: Return in about 6 months (around 09/28/2019) for monitor A1C, see me or message me sooner if needed! Marland Kitchen

## 2019-04-14 LAB — COMPLETE METABOLIC PANEL WITH GFR
AG Ratio: 1.5 (calc) (ref 1.0–2.5)
ALT: 12 U/L (ref 6–29)
AST: 11 U/L (ref 10–35)
Albumin: 4.4 g/dL (ref 3.6–5.1)
Alkaline phosphatase (APISO): 73 U/L (ref 37–153)
BUN: 16 mg/dL (ref 7–25)
CO2: 26 mmol/L (ref 20–32)
Calcium: 9.7 mg/dL (ref 8.6–10.4)
Chloride: 104 mmol/L (ref 98–110)
Creat: 0.7 mg/dL (ref 0.50–1.05)
GFR, Est African American: 115 mL/min/{1.73_m2} (ref >=60)
GFR, Est Non African American: 99 mL/min/{1.73_m2} (ref >=60)
Globulin: 2.9 g/dL (calc) (ref 1.9–3.7)
Glucose, Bld: 142 mg/dL — ABNORMAL HIGH (ref 65–99)
Potassium: 4.1 mmol/L (ref 3.5–5.3)
Sodium: 138 mmol/L (ref 135–146)
Total Bilirubin: 0.8 mg/dL (ref 0.2–1.2)
Total Protein: 7.3 g/dL (ref 6.1–8.1)

## 2019-04-14 LAB — CBC
HCT: 41.7 % (ref 35.0–45.0)
Hemoglobin: 14.1 g/dL (ref 11.7–15.5)
MCH: 30.1 pg (ref 27.0–33.0)
MCHC: 33.8 g/dL (ref 32.0–36.0)
MCV: 88.9 fL (ref 80.0–100.0)
MPV: 11.1 fL (ref 7.5–12.5)
Platelets: 331 10*3/uL (ref 140–400)
RBC: 4.69 10*6/uL (ref 3.80–5.10)
RDW: 12.4 % (ref 11.0–15.0)
WBC: 8.4 10*3/uL (ref 3.8–10.8)

## 2019-04-14 LAB — LIPID PANEL
Cholesterol: 191 mg/dL (ref <200)
HDL: 41 mg/dL — ABNORMAL LOW (ref >=50)
LDL Cholesterol (Calc): 126 mg/dL (calc) — ABNORMAL HIGH
Non-HDL Cholesterol (Calc): 150 mg/dL (calc) — ABNORMAL HIGH (ref <130)
Total CHOL/HDL Ratio: 4.7 (calc) (ref <5.0)
Triglycerides: 128 mg/dL (ref <150)

## 2019-04-14 LAB — HEMOGLOBIN A1C
Hgb A1c MFr Bld: 6.9 % of total Hgb — ABNORMAL HIGH (ref <5.7)
Mean Plasma Glucose: 151 (calc)
eAG (mmol/L): 8.4 (calc)

## 2019-05-13 ENCOUNTER — Other Ambulatory Visit: Payer: Self-pay | Admitting: Family Medicine

## 2019-05-13 DIAGNOSIS — E119 Type 2 diabetes mellitus without complications: Secondary | ICD-10-CM

## 2019-09-28 ENCOUNTER — Ambulatory Visit: Payer: Self-pay | Admitting: Osteopathic Medicine

## 2019-11-09 ENCOUNTER — Ambulatory Visit (INDEPENDENT_AMBULATORY_CARE_PROVIDER_SITE_OTHER): Payer: Self-pay | Admitting: Osteopathic Medicine

## 2019-11-09 ENCOUNTER — Encounter: Payer: Self-pay | Admitting: Osteopathic Medicine

## 2019-11-09 VITALS — BP 109/69 | HR 81 | Wt 152.0 lb

## 2019-11-09 DIAGNOSIS — E119 Type 2 diabetes mellitus without complications: Secondary | ICD-10-CM

## 2019-11-09 DIAGNOSIS — L732 Hidradenitis suppurativa: Secondary | ICD-10-CM

## 2019-11-09 LAB — POCT GLYCOSYLATED HEMOGLOBIN (HGB A1C)
HbA1c POC (<> result, manual entry): 6.5 % (ref 4.0–5.6)
HbA1c, POC (controlled diabetic range): 6.5 % (ref 0.0–7.0)
HbA1c, POC (prediabetic range): 6.5 % — AB (ref 5.7–6.4)
Hemoglobin A1C: 6.5 % — AB (ref 4.0–5.6)

## 2019-11-09 MED ORDER — DOXYCYCLINE HYCLATE 100 MG PO TABS: 100.0000 mg | ORAL_TABLET | Freq: Two times a day (BID) | ORAL | 0 refills | Status: DC

## 2019-11-09 NOTE — Progress Notes (Signed)
HPI: Barbara Lloyd is a 54 y.o. female who  has a past medical history of Diabetes (HCC), High cholesterol, and Hypertension.  she presents to [facility] today, 11/09/19,  for chief complaint of: A1c recheck  Patient reports she has been doing well since her last visit. She has lost 12 lbs and attributes this to not eating in the middle of the night anymore. She states she has always eaten 8+ times per day, including waking up in the night to eat. At her last visit, she was switched from metformin 500mg  BID to 1000mg  in the morning, which has been easier for her. She denies any symptoms, including polyuria or polydipsia.  Patient does note she has a history of "zits under the skin" around her groin, including one currently. She has taken doxycycline for this in the past and would like to have that again. She denies fever, skin changes over the area or significant tenderness.  Past medical, surgical, social and family history reviewed:  Patient Active Problem List   Diagnosis Date Noted  . Hidradenitis suppurativa 11/09/2019  . History of tubal ligation 03/31/2019  . History of endometrial ablation 03/31/2019  . Adopted 03/31/2019  . Influenza vaccination declined 03/31/2019  . Hyperlipidemia 03/28/2016  . Type 2 diabetes mellitus without complication, without long-term current use of insulin (HCC) 03/27/2016  . Essential hypertension 08/16/2015  . Dental caries 08/16/2015  . BP (high blood pressure) 08/16/2015    Past Surgical History:  Procedure Laterality Date  . CESAREAN SECTION     x2  . ENDOMETRIAL ABLATION     2006  . FOOT SURGERY    . GANGLION CYST EXCISION      Social History   Tobacco Use  . Smoking status: Current Every Day Smoker    Packs/day: 0.50    Years: 25.00    Pack years: 12.50    Types: Cigarettes  . Smokeless tobacco: Never Used  Substance Use Topics  . Alcohol use: Yes    Alcohol/week: 3.0 standard drinks     Types: 3 Standard drinks or equivalent per week    Comment: occ    Family History  Adopted: Yes     Current medication list and allergy/intolerance information reviewed:    Current Outpatient Medications  Medication Sig Dispense Refill  . lisinopril (ZESTRIL) 10 MG tablet Take 1 tablet (10 mg total) by mouth daily. 90 tablet 3  . metFORMIN (GLUCOPHAGE) 1000 MG tablet Take 1 tablet (1,000 mg total) by mouth daily with breakfast. 90 tablet 3  . pravastatin (PRAVACHOL) 20 MG tablet Take 1 tablet (20 mg total) by mouth daily. 90 tablet 3  . traZODone (DESYREL) 100 MG tablet Take 0.5-1 tablets (50-100 mg total) by mouth at bedtime as needed for sleep. 90 tablet 3  . doxycycline (VIBRA-TABS) 100 MG tablet Take 1 tablet (100 mg total) by mouth 2 (two) times daily. 14 tablet 0   No current facility-administered medications for this visit.    Allergies  Allergen Reactions  . Ciprofloxacin Rash  . Sulfa Antibiotics Rash      Review of Systems:  Constitutional:  No  fever  HEENT: No  headache  Cardiac: No  chest pain  Respiratory:  No  shortness of breath. No  Cough  Gastrointestinal: No  abdominal pain  Musculoskeletal: No new myalgia/arthralgia  Skin: No  Rash, No other wounds/concerning lesions  Genitourinary: per HPI  Endocrine: No polyuria/polydipsia/polyphagia   Neurologic: No  weakness, No  dizziness  Psychiatric: No  concerns with depression  Exam:  BP 109/69 (BP Location: Left Arm, Patient Position: Sitting)   Pulse 81   Wt 152 lb (68.9 kg)   SpO2 100%   BMI 21.81 kg/m   Constitutional: VS see above. General Appearance: alert, well-developed, well-nourished, NAD  Eyes: Normal lids and conjunctive, non-icteric sclera  Neck: No masses, trachea midline.   Respiratory: Normal respiratory effort. no wheeze, no rhonchi, no rales  Cardiovascular: S1/S2 normal, no murmur, no rub/gallop auscultated. RRR.   Gastrointestinal: Nontender, no  masses.  Musculoskeletal: Gait normal. No clubbing/cyanosis of digits.   Neurological: Normal balance/coordination. No tremor. No cranial nerve deficit on limited exam.   Skin: warm, dry, intact. No rash/ulcer.   Psychiatric: Normal judgment/insight. Normal mood and affect. Oriented x3.    Results for orders placed or performed in visit on 11/09/19 (from the past 72 hour(s))  POCT HgB A1C     Status: Abnormal   Collection Time: 11/09/19  4:07 PM  Result Value Ref Range   Hemoglobin A1C 6.5 (A) 4.0 - 5.6 %   HbA1c POC (<> result, manual entry) 6.5 4.0 - 5.6 %   HbA1c, POC (prediabetic range) 6.5 (A) 5.7 - 6.4 %   HbA1c, POC (controlled diabetic range) 6.5 0.0 - 7.0 %    No results found.      ASSESSMENT/PLAN: The primary encounter diagnosis was Type 2 diabetes mellitus without complication, without long-term current use of insulin (HCC). A diagnosis of Hidradenitis suppurativa was also pertinent to this visit.   Diabetes Mellitus, Type 2  A1c 6.5 today, improved from 6.9 in January.  Continue metformin 1000mg  daily  Recheck A1c in 3 months.  Hyperlipidemia  Patient requested more information about how to monitor dietary cholesterol intake.  Also recommended omega 3/fish oil supplement  Will recheck lipid panel with annual labs in 6 months.   Hidradenitis Suppurativa  Sent doxycycline prescription, which patient can take when symptoms occur.  Also advised warm compresses to help with this.  Orders Placed This Encounter  Procedures  . POCT HgB A1C    Meds ordered this encounter  Medications  . doxycycline (VIBRA-TABS) 100 MG tablet    Sig: Take 1 tablet (100 mg total) by mouth 2 (two) times daily.    Dispense:  14 tablet    Refill:  0    Patient Instructions  Fat and Cholesterol Restricted Eating Plan Getting too much fat and cholesterol in your diet may cause health problems. Choosing the right foods helps keep your fat and cholesterol at normal  levels. This can keep you from getting certain diseases.  What are tips for following this plan? Meal planning  At meals, divide your plate into four equal parts: ? Fill one-half of your plate with vegetables and green salads. ? Fill one-fourth of your plate with whole grains. ? Fill one-fourth of your plate with low-fat (lean) protein foods.  Eat fish that is high in omega-3 fats at least two times a week. This includes mackerel, tuna, sardines, and salmon.  Eat foods that are high in fiber, such as whole grains, beans, apples, broccoli, carrots, peas, and barley. General tips   Work with your doctor to lose weight if you need to.  Avoid: ? Foods with added sugar. ? Fried foods. ? Foods with partially hydrogenated oils.  Limit alcohol intake to no more than 1 drink a day for nonpregnant women and 2 drinks a day for men. One drink equals 12  oz of beer, 5 oz of wine, or 1 oz of hard liquor. Reading food labels  Check food labels for: ? Trans fats. ? Partially hydrogenated oils. ? Saturated fat (g) in each serving. ? Cholesterol (mg) in each serving. ? Fiber (g) in each serving.  Choose foods with healthy fats, such as: ? Monounsaturated fats. ? Polyunsaturated fats. ? Omega-3 fats.  Choose grain products that have whole grains. Look for the word "whole" as the first word in the ingredient list. Cooking  Cook foods using low-fat methods. These include baking, boiling, grilling, and broiling.  Eat more home-cooked foods. Eat at restaurants and buffets less often.  Avoid cooking using saturated fats, such as butter, cream, palm oil, palm kernel oil, and coconut oil. Recommended foods  Fruits  All fresh, canned (in natural juice), or frozen fruits. Vegetables  Fresh or frozen vegetables (raw, steamed, roasted, or grilled). Green salads. Grains  Whole grains, such as whole wheat or whole grain breads, crackers, cereals, and pasta. Unsweetened oatmeal, bulgur,  barley, quinoa, or brown rice. Corn or whole wheat flour tortillas. Meats and other protein foods  Ground beef (85% or leaner), grass-fed beef, or beef trimmed of fat. Skinless chicken or [place]. Ground chicken or [place]. Pork trimmed of fat. All fish and seafood. Egg whites. Dried beans, peas, or lentils. Unsalted nuts or seeds. Unsalted canned beans. Nut butters without added sugar or oil. Dairy  Low-fat or nonfat dairy products, such as skim or 1% milk, 2% or reduced-fat cheeses, low-fat and fat-free ricotta or cottage cheese, or plain low-fat and nonfat yogurt. Fats and oils  Tub margarine without trans fats. Light or reduced-fat mayonnaise and salad dressings. Avocado. Olive, canola, sesame, or safflower oils. The items listed above may not be a complete list of foods and beverages you can eat. Contact a dietitian for more information. Foods to avoid Fruits  Canned fruit in heavy syrup. Fruit in cream or butter sauce. Fried fruit. Vegetables  Vegetables cooked in cheese, cream, or butter sauce. Fried vegetables. Grains  White bread. White pasta. White rice. Cornbread. Bagels, pastries, and croissants. Crackers and snack foods that contain trans fat and hydrogenated oils. Meats and other protein foods  Fatty cuts of meat. Ribs, chicken wings, bacon, sausage, bologna, salami, chitterlings, fatback, hot dogs, bratwurst, and packaged lunch meats. Liver and organ meats. Whole eggs and egg yolks. Chicken and [place] with skin. Fried meat. Dairy  Whole or 2% milk, cream, half-and-half, and cream cheese. Whole milk cheeses. Whole-fat or sweetened yogurt. Full-fat cheeses. Nondairy creamers and whipped toppings. Processed cheese, cheese spreads, and cheese curds. Beverages  Alcohol. Sugar-sweetened drinks such as sodas, lemonade, and fruit drinks. Fats and oils  Butter, stick margarine, lard, shortening, ghee, or bacon fat. Coconut, palm kernel, and palm oils. Sweets and desserts  Corn  syrup, sugars, honey, and molasses. Candy. Jam and jelly. Syrup. Sweetened cereals. Cookies, pies, cakes, donuts, muffins, and ice cream. The items listed above may not be a complete list of foods and beverages you should avoid. Contact a dietitian for more information. Summary  Choosing the right foods helps keep your fat and cholesterol at normal levels. This can keep you from getting certain diseases.  At meals, fill one-half of your plate with vegetables and green salads.  Eat high-fiber foods, like whole grains, beans, apples, carrots, peas, and barley.  Limit added sugar, saturated fats, alcohol, and fried foods. This information is not intended to replace advice given to you by your health care  provider. Make sure you discuss any questions you have with your health care provider. Document Revised: 11/06/2017 Document Reviewed: 11/20/2016 Elsevier Patient Education  2020 [employer]         Visit summary with medication list and pertinent instructions was printed for patient to review. All questions at time of visit were answered - patient instructed to contact office with any additional concerns or updates. ER/RTC precautions were reviewed with the patient.   Please note: voice recognition software was used to produce this document, and typos may escape review. Please contact Dr. Lyn HollingsheadAlexander for any needed clarifications.   Time spent: 15 mins   Follow-up plan: Return in about 6 months (around 05/11/2020) for ANNUAL (call week prior to visit for lab orders).  Rutha BouchardAlison Ko B[place], [facility] MS3

## 2019-11-09 NOTE — Patient Instructions (Signed)
Fat and Cholesterol Restricted Eating Plan Getting too much fat and cholesterol in your diet may cause health problems. Choosing the right foods helps keep your fat and cholesterol at normal levels. This can keep you from getting certain diseases.  What are tips for following this plan? Meal planning  At meals, divide your plate into four equal parts: ? Fill one-half of your plate with vegetables and green salads. ? Fill one-fourth of your plate with whole grains. ? Fill one-fourth of your plate with low-fat (lean) protein foods.  Eat fish that is high in omega-3 fats at least two times a week. This includes mackerel, tuna, sardines, and salmon.  Eat foods that are high in fiber, such as whole grains, beans, apples, broccoli, carrots, peas, and barley. General tips   Work with your doctor to lose weight if you need to.  Avoid: ? Foods with added sugar. ? Fried foods. ? Foods with partially hydrogenated oils.  Limit alcohol intake to no more than 1 drink a day for nonpregnant women and 2 drinks a day for men. One drink equals 12 oz of beer, 5 oz of wine, or 1 oz of hard liquor. Reading food labels  Check food labels for: ? Trans fats. ? Partially hydrogenated oils. ? Saturated fat (g) in each serving. ? Cholesterol (mg) in each serving. ? Fiber (g) in each serving.  Choose foods with healthy fats, such as: ? Monounsaturated fats. ? Polyunsaturated fats. ? Omega-3 fats.  Choose grain products that have whole grains. Look for the word "whole" as the first word in the ingredient list. Cooking  Cook foods using low-fat methods. These include baking, boiling, grilling, and broiling.  Eat more home-cooked foods. Eat at restaurants and buffets less often.  Avoid cooking using saturated fats, such as butter, cream, palm oil, palm kernel oil, and coconut oil. Recommended foods  Fruits  All fresh, canned (in natural juice), or frozen fruits. Vegetables  Fresh or frozen  vegetables (raw, steamed, roasted, or grilled). Green salads. Grains  Whole grains, such as whole wheat or whole grain breads, crackers, cereals, and pasta. Unsweetened oatmeal, bulgur, barley, quinoa, or brown rice. Corn or whole wheat flour tortillas. Meats and other protein foods  Ground beef (85% or leaner), grass-fed beef, or beef trimmed of fat. Skinless chicken or turkey. Ground chicken or turkey. Pork trimmed of fat. All fish and seafood. Egg whites. Dried beans, peas, or lentils. Unsalted nuts or seeds. Unsalted canned beans. Nut butters without added sugar or oil. Dairy  Low-fat or nonfat dairy products, such as skim or 1% milk, 2% or reduced-fat cheeses, low-fat and fat-free ricotta or cottage cheese, or plain low-fat and nonfat yogurt. Fats and oils  Tub margarine without trans fats. Light or reduced-fat mayonnaise and salad dressings. Avocado. Olive, canola, sesame, or safflower oils. The items listed above may not be a complete list of foods and beverages you can eat. Contact a dietitian for more information. Foods to avoid Fruits  Canned fruit in heavy syrup. Fruit in cream or butter sauce. Fried fruit. Vegetables  Vegetables cooked in cheese, cream, or butter sauce. Fried vegetables. Grains  White bread. White pasta. White rice. Cornbread. Bagels, pastries, and croissants. Crackers and snack foods that contain trans fat and hydrogenated oils. Meats and other protein foods  Fatty cuts of meat. Ribs, chicken wings, bacon, sausage, bologna, salami, chitterlings, fatback, hot dogs, bratwurst, and packaged lunch meats. Liver and organ meats. Whole eggs and egg yolks. Chicken and turkey with   skin. Fried meat. Dairy  Whole or 2% milk, cream, half-and-half, and cream cheese. Whole milk cheeses. Whole-fat or sweetened yogurt. Full-fat cheeses. Nondairy creamers and whipped toppings. Processed cheese, cheese spreads, and cheese curds. Beverages  Alcohol. Sugar-sweetened drinks  such as sodas, lemonade, and fruit drinks. Fats and oils  Butter, stick margarine, lard, shortening, ghee, or bacon fat. Coconut, palm kernel, and palm oils. Sweets and desserts  Corn syrup, sugars, honey, and molasses. Candy. Jam and jelly. Syrup. Sweetened cereals. Cookies, pies, cakes, donuts, muffins, and ice cream. The items listed above may not be a complete list of foods and beverages you should avoid. Contact a dietitian for more information. Summary  Choosing the right foods helps keep your fat and cholesterol at normal levels. This can keep you from getting certain diseases.  At meals, fill one-half of your plate with vegetables and green salads.  Eat high-fiber foods, like whole grains, beans, apples, carrots, peas, and barley.  Limit added sugar, saturated fats, alcohol, and fried foods. This information is not intended to replace advice given to you by your health care provider. Make sure you discuss any questions you have with your health care provider. Document Revised: 11/06/2017 Document Reviewed: 11/20/2016 Elsevier Patient Education  2020 Elsevier Inc.  

## 2020-02-09 ENCOUNTER — Emergency Department: Admit: 2020-02-09 | Discharge status: Absent without leave | Payer: Self-pay

## 2020-02-09 ENCOUNTER — Other Ambulatory Visit: Payer: Self-pay

## 2020-02-09 ENCOUNTER — Emergency Department
Admission: RE | Admit: 2020-02-09 | Discharge: 2020-02-09 | Disposition: A | Discharge status: Home / Self Care | Payer: Self-pay | Source: Ambulatory Visit | Attending: Family Medicine | Admitting: Family Medicine

## 2020-02-09 VITALS — BP 119/74 | HR 77 | Temp 98.2°F | Resp 15

## 2020-02-09 DIAGNOSIS — B001 Herpesviral vesicular dermatitis: Secondary | ICD-10-CM

## 2020-02-09 MED ORDER — VALACYCLOVIR HCL 1 G PO TABS: ORAL_TABLET | ORAL | 2 refills | Status: DC

## 2020-02-09 NOTE — Discharge Instructions (Signed)
Recommend having your vitamin D level checked

## 2020-02-09 NOTE — ED Provider Notes (Signed)
[facility]    CSN: [id number] Arrival date & time: 02/09/20  1255      History   Chief Complaint Chief Complaint  Patient presents with  . Mouth Lesions    HPI Barbara Lloyd is a 54 y.o. female.   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  She developed another cold sore on her lower lip two days ago which is the 3rd time in two months.  She feels well otherwise.  The history is provided by the patient.  Mouth Lesions Location:  Lower lip Quality:  Blistered and red Onset quality:  Sudden Severity:  Moderate Duration:  2 days Progression:  Worsening Chronicity:  Recurrent Relieved by:  Nothing Worsened by:  Nothing Ineffective treatments:  None tried Associated symptoms: no congestion, no dental pain, no ear pain, no fever, no malaise, no neck pain, no rash, no rhinorrhea, no sore throat and no swollen glands     Past Medical History:  Diagnosis Date  . Diabetes (HCC)   . High cholesterol   . Hypertension     Patient Active Problem List   Diagnosis Date Noted  . Hidradenitis suppurativa 11/09/2019  . History of tubal ligation 03/31/2019  . History of endometrial ablation 03/31/2019  . Adopted 03/31/2019  . Influenza vaccination declined 03/31/2019  . Hyperlipidemia 03/28/2016  . Type 2 diabetes mellitus without complication, without long-term current use of insulin (HCC) 03/27/2016  . Essential hypertension 08/16/2015  . Dental caries 08/16/2015  . BP (high blood pressure) 08/16/2015    Past Surgical History:  Procedure Laterality Date  . CESAREAN SECTION     x2  . ENDOMETRIAL ABLATION     2006  . FOOT SURGERY    . GANGLION CYST EXCISION      OB History   No obstetric history on file.      Home Medications    Prior to Admission medications   Medication Sig Start Date End Date Taking? Authorizing Provider  doxycycline (VIBRA-TABS) 100 MG tablet Take 1 tablet (100 mg total) by mouth 2 (two)  times daily. 11/09/19   Sunnie Nielsen, DO  lisinopril (ZESTRIL) 10 MG tablet Take 1 tablet (10 mg total) by mouth daily. 03/31/19   Sunnie Nielsen, DO  metFORMIN (GLUCOPHAGE) 1000 MG tablet Take 1 tablet (1,000 mg total) by mouth daily with breakfast. 03/31/19   Sunnie Nielsen, DO  pravastatin (PRAVACHOL) 20 MG tablet Take 1 tablet (20 mg total) by mouth daily. 03/31/19   Sunnie Nielsen, DO  traZODone (DESYREL) 100 MG tablet Take 0.5-1 tablets (50-100 mg total) by mouth at bedtime as needed for sleep. 03/31/19   Sunnie Nielsen, DO  valACYclovir (VALTREX) 1000 MG tablet Take 2 tabs PO Q12hr for one day 02/09/20   Lattie Haw, MD    Family History Family History  Adopted: Yes    Social History Social History   Tobacco Use  . Smoking status: Current Every Day Smoker    Packs/day: 0.50    Years: 25.00    Pack years: 12.50    Types: Cigarettes  . Smokeless tobacco: Never Used  Vaping Use  . Vaping Use: Never used  Substance Use Topics  . Alcohol use: Yes    Alcohol/week: 3.0 standard drinks    Types: 3 Standard drinks or equivalent per week    Comment: occ  . Drug use: No     Allergies   Ciprofloxacin and Sulfa antibiotics   Review of Systems  Review of Systems  Constitutional: Negative for activity change, appetite change, chills, diaphoresis, fatigue and fever.  HENT: Positive for mouth sores. Negative for congestion, ear pain, rhinorrhea and sore throat.   Eyes: Negative.   Respiratory: Negative.   Cardiovascular: Negative.   Genitourinary: Negative.   Musculoskeletal: Negative for neck pain.  Skin: Negative for rash.  Neurological: Negative for headaches.     Physical Exam Triage Vital Signs ED Triage Vitals  Enc Vitals Group     BP 02/09/20 1326 119/74     Pulse Rate 02/09/20 1326 77     Resp 02/09/20 1326 15     Temp 02/09/20 1326 98.2 F (36.8 C)     Temp Source 02/09/20 1326 Oral     SpO2 02/09/20 1326 99 %     Weight --       Height --      Head Circumference --      Peak Flow --      Pain Score 02/09/20 1324 0     Pain Loc --      Pain Edu? --      Excl. in GC? --    No data found.  Updated Vital Signs BP 119/74 (BP Location: Right Arm)   Pulse 77   Temp 98.2 F (36.8 C) (Oral)   Resp 15   SpO2 99%   Visual Acuity Right Eye Distance:   Left Eye Distance:   Bilateral Distance:    Right Eye Near:   Left Eye Near:    Bilateral Near:     Physical Exam Vitals and nursing note reviewed.  Constitutional:      General: She is not in acute distress. HENT:     Head: Normocephalic.     Right Ear: Tympanic membrane, ear canal and external ear normal.     Left Ear: Tympanic membrane, ear canal and external ear normal.     Nose: Nose normal.     Mouth/Throat:     Mouth: Mucous membranes are moist.      Comments: Herpetiform lesion lower external mid-lip as noted on diagram.  Mildly tender to palpation. Eyes:     Conjunctiva/sclera: Conjunctivae normal.     Pupils: Pupils are equal, round, and reactive to light.  Cardiovascular:     Rate and Rhythm: Normal rate and regular rhythm.     Heart sounds: Normal heart sounds.  Pulmonary:     Breath sounds: Normal breath sounds.  Abdominal:     Palpations: Abdomen is soft.     Tenderness: There is no abdominal tenderness.  Musculoskeletal:     Cervical back: Neck supple.     Right lower leg: No edema.     Left lower leg: No edema.  Lymphadenopathy:     Cervical: No cervical adenopathy.  Skin:    General: Skin is warm and dry.  Neurological:     Mental Status: She is alert and oriented to person, place, and time.      UC Treatments / Results  Labs (all labs ordered are listed, but only abnormal results are displayed) Labs Reviewed - No data to display  EKG   Radiology No results found.  Procedures Procedures (including critical care time)  Medications Ordered in UC Medications - No data to display  Initial Impression / Assessment  and Plan / UC Course  I have reviewed the triage vital signs and the nursing notes.  Pertinent labs & imaging results that were available during my care of  the patient were reviewed by me and considered in my medical decision making (see chart for details).    Rx for Valtrex one gm, 2 PO Q12hr for one day.  #4, 2 refills. Followup with Family Doctor if not improved in one week.    Final Clinical Impressions(s) / UC Diagnoses   Final diagnoses:  Herpes simplex labialis     Discharge Instructions     Recommend having your vitamin D level checked    ED Prescriptions    Medication Sig Dispense Auth. Provider   valACYclovir (VALTREX) 1000 MG tablet Take 2 tabs PO Q12hr for one day 4 tablet Lattie Haw, MD        Lattie Haw, MD 02/17/20 [phone]

## 2020-02-09 NOTE — ED Triage Notes (Signed)
Pt presents with cold sore on bottom lip patient stated she applied Carmex with no improvement of symptoms. Pt has had cold sore most of her life with minimal outbreaks but has had a recent increase in outbreaks 3 cold sores in last 3 months.

## 2020-03-24 ENCOUNTER — Other Ambulatory Visit: Payer: Self-pay | Admitting: Osteopathic Medicine

## 2020-03-30 DIAGNOSIS — U071 COVID-19: Secondary | ICD-10-CM

## 2020-03-30 HISTORY — DX: COVID-19: U07.1

## 2020-03-31 ENCOUNTER — Other Ambulatory Visit: Payer: Self-pay | Admitting: Osteopathic Medicine

## 2020-03-31 DIAGNOSIS — I10 Essential (primary) hypertension: Secondary | ICD-10-CM

## 2020-03-31 DIAGNOSIS — E119 Type 2 diabetes mellitus without complications: Secondary | ICD-10-CM

## 2020-04-09 ENCOUNTER — Emergency Department
Admission: RE | Admit: 2020-04-09 | Discharge: 2020-04-09 | Disposition: A | Discharge status: Home / Self Care | Payer: Self-pay | Source: Ambulatory Visit

## 2020-04-09 ENCOUNTER — Other Ambulatory Visit: Payer: Self-pay

## 2020-04-09 ENCOUNTER — Emergency Department (INDEPENDENT_AMBULATORY_CARE_PROVIDER_SITE_OTHER): Payer: Self-pay

## 2020-04-09 VITALS — BP 113/76 | HR 81 | Temp 98.3°F | Resp 17 | Ht 70.0 in | Wt 158.0 lb

## 2020-04-09 DIAGNOSIS — U071 COVID-19: Secondary | ICD-10-CM

## 2020-04-09 DIAGNOSIS — J069 Acute upper respiratory infection, unspecified: Secondary | ICD-10-CM

## 2020-04-09 DIAGNOSIS — J42 Unspecified chronic bronchitis: Secondary | ICD-10-CM

## 2020-04-09 MED ORDER — DOXYCYCLINE HYCLATE 100 MG PO CAPS: 100.0000 mg | ORAL_CAPSULE | Freq: Two times a day (BID) | ORAL | 0 refills | Status: DC

## 2020-04-09 NOTE — ED Provider Notes (Signed)
[facility]    CSN: [id number] Arrival date & time: 04/09/20  1442      History   Chief Complaint Chief Complaint  Patient presents with   Covid Positive   Cough    HPI Barbara Lloyd is a 56 y.o. female.   HPI Barbara Lloyd is a 55 y.o. female presenting to UC with c/o soreness with cough and congestion that started 4 days ago.  She tested positive for COVID on 03/30/20.  She received Pfizer COVID vaccine in September 2021.  She does report quitting smoking the same day cough and soreness started.  Pt denies fever, chills, n/v/d. No SOB. She note work is requesting a negative COVID test before pt can return in-person.    Past Medical History:  Diagnosis Date   COVID-19 03/30/2020   Diabetes (HCC)    High cholesterol    Hypertension     Patient Active Problem List   Diagnosis Date Noted   Hidradenitis suppurativa 11/09/2019   History of tubal ligation 03/31/2019   History of endometrial ablation 03/31/2019   Adopted 03/31/2019   Influenza vaccination declined 03/31/2019   Hyperlipidemia 03/28/2016   Type 2 diabetes mellitus without complication, without long-term current use of insulin (HCC) 03/27/2016   Essential hypertension 08/16/2015   Dental caries 08/16/2015   BP (high blood pressure) 08/16/2015    Past Surgical History:  Procedure Laterality Date   CESAREAN SECTION     x2   ENDOMETRIAL ABLATION     2006   FOOT SURGERY     GANGLION CYST EXCISION      OB History   No obstetric history on file.      Home Medications    Prior to Admission medications   Medication Sig Start Date End Date Taking? Authorizing Provider  doxycycline (VIBRAMYCIN) 100 MG capsule Take 1 capsule (100 mg total) by mouth 2 (two) times daily for 7 days. 04/09/20 04/16/20 Yes Alaysiah Browder O, PA-C  lisinopril (ZESTRIL) 10 MG tablet TAKE 1 TABLET BY MOUTH EVERY DAY 03/31/20  Yes Sunnie Nielsen, DO  metFORMIN (GLUCOPHAGE) 1000 MG tablet TAKE  1 TABLET BY MOUTH EVERY DAY WITH BREAKFAST 03/31/20  Yes Sunnie Nielsen, DO  pravastatin (PRAVACHOL) 20 MG tablet TAKE 1 TABLET BY MOUTH EVERY DAY 03/31/20  Yes Sunnie Nielsen, DO  traZODone (DESYREL) 100 MG tablet Take 0.5-1 tablets (50-100 mg total) by mouth at bedtime as needed for sleep. appt for refills 03/24/20  Yes Sunnie Nielsen, DO  valACYclovir (VALTREX) 1000 MG tablet Take 2 tabs PO Q12hr for one day 02/09/20   Lattie Haw, MD    Family History Family History  Adopted: Yes    Social History Social History   Tobacco Use   Smoking status: Former Smoker    Packs/day: 0.50    Years: 25.00    Pack years: 12.50    Types: Cigarettes    Quit date: 04/06/2020    Years since quitting: 0.0   Smokeless tobacco: Never Used  Vaping Use   Vaping Use: Never used  Substance Use Topics   Alcohol use: Yes    Alcohol/week: 3.0 standard drinks    Types: 3 Standard drinks or equivalent per week    Comment: occ   Drug use: No     Allergies   Ciprofloxacin and Sulfa antibiotics   Review of Systems Review of Systems  Constitutional: Negative for chills and fever.  HENT: Positive for congestion. Negative for ear pain, sore throat, trouble swallowing and  voice change.   Respiratory: Positive for cough. Negative for shortness of breath.   Cardiovascular: Positive for chest pain (soreness with cough). Negative for palpitations.  Gastrointestinal: Negative for abdominal pain, diarrhea, nausea and vomiting.  Musculoskeletal: Negative for arthralgias, back pain and myalgias.  Skin: Negative for rash.  All other systems reviewed and are negative.    Physical Exam Triage Vital Signs ED Triage Vitals  Enc Vitals Group     BP 04/09/20 1521 113/76     Pulse Rate 04/09/20 1521 81     Resp 04/09/20 1521 17     Temp 04/09/20 1521 98.3 F (36.8 C)     Temp Source 04/09/20 1521 Oral     SpO2 04/09/20 1521 99 %     Weight 04/09/20 1525 158 lb (71.7 kg)     Height  04/09/20 1525 5\' 10"  (1.778 m)     Head Circumference --      Peak Flow --      Pain Score 04/09/20 1525 3     Pain Loc --      Pain Edu? --      Excl. in GC? --    No data found.  Updated Vital Signs BP 113/76 (BP Location: Right Arm)    Pulse 81    Temp 98.3 F (36.8 C) (Oral)    Resp 17    Ht 5\' 10"  (1.778 m)    Wt 158 lb (71.7 kg)    SpO2 99%    BMI 22.67 kg/m   Visual Acuity Right Eye Distance:   Left Eye Distance:   Bilateral Distance:    Right Eye Near:   Left Eye Near:    Bilateral Near:     Physical Exam Vitals and nursing note reviewed.  Constitutional:      General: She is not in acute distress.    Appearance: Normal appearance. She is well-developed and well-nourished. She is not ill-appearing, toxic-appearing or diaphoretic.  HENT:     Head: Normocephalic and atraumatic.     Right Ear: Tympanic membrane and ear canal normal.     Left Ear: Tympanic membrane and ear canal normal.     Nose: Nose normal.     Mouth/Throat:     Mouth: Mucous membranes are moist.     Pharynx: Oropharynx is clear.  Eyes:     Extraocular Movements: EOM normal.  Cardiovascular:     Rate and Rhythm: Normal rate and regular rhythm.  Pulmonary:     Effort: Pulmonary effort is normal. No respiratory distress.     Breath sounds: No stridor. Rhonchi (faint diffuse) present. No wheezing or rales.  Musculoskeletal:        General: Normal range of motion.     Cervical back: Normal range of motion and neck supple. No tenderness.  Lymphadenopathy:     Cervical: No cervical adenopathy.  Skin:    General: Skin is warm and dry.  Neurological:     Mental Status: She is alert and oriented to person, place, and time.  Psychiatric:        Mood and Affect: Mood and affect normal.        Behavior: Behavior normal.      UC Treatments / Results  Labs (all labs ordered are listed, but only abnormal results are displayed) Labs Reviewed - No data to display  EKG   Radiology DG Chest 2  View  Result Date: 04/09/2020 CLINICAL DATA:  Cough, congestion, COVID-19 positive EXAM: CHEST -  2 VIEW COMPARISON:  04/19/2017, 03/06/2016 FINDINGS: The heart size and mediastinal contours are within normal limits. Subtle bibasilar interstitial prominence is similar in appearance to the previous radiographs. Lungs are otherwise clear. No pleural effusion or pneumothorax. The visualized skeletal structures are unremarkable. IMPRESSION: Subtle bibasilar interstitial prominence is similar in appearance to the previous radiographs, which may reflect chronic bronchitic type lung changes. Electronically Signed   By: Duanne Guess D.O.   On: 04/09/2020 16:00    Procedures Procedures (including critical care time)  Medications Ordered in UC Medications - No data to display  Initial Impression / Assessment and Plan / UC Course  I have reviewed the triage vital signs and the nursing notes.  Pertinent labs & imaging results that were available during my care of the patient were reviewed by me and considered in my medical decision making (see chart for details).    Discussed imaging with pt Pt denies known hx of COPD or asthma. Encouraged continued symptomatic tx Prescription to hold provided for doxycycline if cough persists or symptoms worsen. Encouraged f/u with PCP or post-covid care center about CXR Pt understanding CDC does not recommend retesting within 3 months of positive COVID test Work note provided  Final Clinical Impressions(s) / UC Diagnoses   Final diagnoses:  Viral URI with cough  Chronic bronchitis, unspecified chronic bronchitis type (HCC)  COVID     Discharge Instructions      You may start the antibiotic if your cough worsens or you develop fever or sinus pain.  Call to schedule a follow up visit with primary care or the post-covid care clinic later this week if not continuing to improve.   Call 911 or have someone drive you to the hospital if symptoms  significantly worsening.   CDC recommends isolation for 5 days from symptom onset and fever free for 24 hours without use of fever reducing medication and symptoms improving. Then wear a well fitting mask for 5 days when around others. If not improving, isolate for 10 days and consider reevaluation by a medical provider to rule out a secondary bacterial infection.   Call 911 or have someone drive you to the hospital if symptoms significantly worsening.     ED Prescriptions    Medication Sig Dispense Auth. Provider   doxycycline (VIBRAMYCIN) 100 MG capsule Take 1 capsule (100 mg total) by mouth 2 (two) times daily for 7 days. 14 capsule Lurene Shadow, [place]     PDMP not reviewed this encounter.   Lurene Shadow, [place] 04/10/20 1105

## 2020-04-09 NOTE — Discharge Instructions (Addendum)
  You may start the antibiotic if your cough worsens or you develop fever or sinus pain.  Call to schedule a follow up visit with primary care or the post-covid care clinic later this week if not continuing to improve.   Call 911 or have someone drive you to the hospital if symptoms significantly worsening.   CDC recommends isolation for 5 days from symptom onset and fever free for 24 hours without use of fever reducing medication and symptoms improving. Then wear a well fitting mask for 5 days when around others. If not improving, isolate for 10 days and consider reevaluation by a medical provider to rule out a secondary bacterial infection.   Call 911 or have someone drive you to the hospital if symptoms significantly worsening.

## 2020-04-09 NOTE — ED Triage Notes (Signed)
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

## 2020-05-23 ENCOUNTER — Encounter: Payer: Self-pay | Admitting: Osteopathic Medicine

## 2020-05-30 ENCOUNTER — Other Ambulatory Visit: Payer: Self-pay

## 2020-05-30 ENCOUNTER — Emergency Department
Admission: RE | Admit: 2020-05-30 | Discharge: 2020-05-30 | Disposition: A | Discharge status: Home / Self Care | Payer: BC Managed Care – PPO | Source: Ambulatory Visit

## 2020-05-30 VITALS — BP 126/66 | HR 83 | Temp 99.0°F | Resp 16

## 2020-05-30 DIAGNOSIS — R350 Frequency of micturition: Secondary | ICD-10-CM | POA: Diagnosis not present

## 2020-05-30 DIAGNOSIS — R300 Dysuria: Secondary | ICD-10-CM | POA: Diagnosis not present

## 2020-05-30 DIAGNOSIS — R3915 Urgency of urination: Secondary | ICD-10-CM

## 2020-05-30 LAB — POCT URINALYSIS DIP (MANUAL ENTRY)
Bilirubin, UA: NEGATIVE
Blood, UA: NEGATIVE
Glucose, UA: 100 mg/dL — AB
Ketones, POC UA: NEGATIVE mg/dL
Leukocytes, UA: NEGATIVE
Nitrite, UA: POSITIVE — AB
Spec Grav, UA: 1.01 (ref 1.010–1.025)
Urobilinogen, UA: 1 E.U./dL
pH, UA: 5 (ref 5.0–8.0)

## 2020-05-30 NOTE — ED Triage Notes (Signed)
Patient presents for dysuria, urine frequency. Started about 1 week ago. Has tried Azo, has taken 2 today.

## 2020-05-30 NOTE — Discharge Instructions (Addendum)
You may have a urinary tract infection. We are going to culture your urine and will call you as soon as we have the results.   Drink plenty of water, 8-10 glasses per day.   You may take AZO over the counter for painful urination.  Follow up with your primary care provider as needed.   Go to the Emergency Department if you experience severe pain, shortness of breath, high fever, or other concerns.   

## 2020-05-30 NOTE — ED Provider Notes (Signed)
[facility]   CC: UTI  SUBJECTIVE:  Barbara Lloyd is a 55 y.o. female who complains of urinary frequency, urgency and dysuria for the past 2 days. Patient denies a precipitating event, recent sexual encounter, excessive caffeine intake. Localizes the pain to the lower abdomen. Pain is intermittent and describes it as pressure. Has tried OTC medications with relief. Symptoms are made worse with urination. Admits to similar symptoms in the past. Denies fever, chills, nausea, vomiting, abdominal pain, flank pain, abnormal vaginal discharge or bleeding, hematuria.    LMP: No LMP recorded. Patient has had an ablation.  ROS: As in HPI.  All other pertinent ROS negative.     Past Medical History:  Diagnosis Date  . COVID-19 03/30/2020  . Diabetes (HCC)   . High cholesterol   . Hypertension    Past Surgical History:  Procedure Laterality Date  . CESAREAN SECTION     x2  . ENDOMETRIAL ABLATION     2006  . FOOT SURGERY    . GANGLION CYST EXCISION     Allergies  Allergen Reactions  . Ciprofloxacin Rash  . Sulfa Antibiotics Rash   No current facility-administered medications on file prior to encounter.   Current Outpatient Medications on File Prior to Encounter  Medication Sig Dispense Refill  . lisinopril (ZESTRIL) 10 MG tablet TAKE 1 TABLET BY MOUTH EVERY DAY 90 tablet 0  . metFORMIN (GLUCOPHAGE) 1000 MG tablet TAKE 1 TABLET BY MOUTH EVERY DAY WITH BREAKFAST 90 tablet 0  . pravastatin (PRAVACHOL) 20 MG tablet TAKE 1 TABLET BY MOUTH EVERY DAY 90 tablet 0  . traZODone (DESYREL) 100 MG tablet Take 0.5-1 tablets (50-100 mg total) by mouth at bedtime as needed for sleep. appt for refills 90 tablet 0  . valACYclovir (VALTREX) 1000 MG tablet Take 2 tabs PO Q12hr for one day 4 tablet 2   Social History   Socioeconomic History  . Marital status: Single    Spouse name: Not on file  . Number of children: Not on file  . Years of education: Not on file  . Highest education  level: Not on file  Occupational History  . Occupation: Financial trader SERVICE    Employer: [employer]  Tobacco Use  . Smoking status: Current Every Day Smoker    Packs/day: 0.50    Years: 25.00    Pack years: 12.50    Types: Cigarettes    Last attempt to quit: 04/06/2020    Years since quitting: 0.1  . Smokeless tobacco: Never Used  Vaping Use  . Vaping Use: Never used  Substance and Sexual Activity  . Alcohol use: Yes    Alcohol/week: 3.0 standard drinks    Types: 3 Standard drinks or equivalent per week    Comment: occ  . Drug use: No  . Sexual activity: Yes    Partners: Male    Birth control/protection: None  Other Topics Concern  . Not on file  Social History Narrative  . Not on file   Social Determinants of Health   Financial Resource Strain: Not on file  Food Insecurity: Not on file  Transportation Needs: Not on file  Physical Activity: Not on file  Stress: Not on file  Social Connections: Not on file  Intimate Partner Violence: Not on file   Family History  Adopted: Yes    OBJECTIVE:  Vitals:   05/30/20 1914  BP: 126/66  Pulse: 83  Resp: 16  Temp: 99 F (37.2 C)  TempSrc: Oral  SpO2: 96%   General appearance: AOx3 in no acute distress HEENT: NCAT. Oropharynx clear.  Lungs: clear to auscultation bilaterally without adventitious breath sounds Heart: regular rate and rhythm. Radial pulses 2+ symmetrical bilaterally Abdomen: soft; non-distended; no tenderness; bowel sounds present; no guarding or rebound tenderness Back: no CVA tenderness Extremities: no edema; symmetrical with no gross deformities Skin: warm and dry Neurologic: Ambulates from chair to exam table without difficulty Psychological: alert and cooperative; normal mood and affect  Labs Reviewed  POCT URINALYSIS DIP (MANUAL ENTRY) - Abnormal; Notable for the following components:      Result Value   Color, UA orange (*)    Glucose, UA =100 (*)    Protein Ur, POC trace (*)     Nitrite, UA Positive (*)    All other components within normal limits  URINE CULTURE    ASSESSMENT & PLAN:  1. Dysuria   2. Urinary urgency   3. Urinary frequency    Urine culture sent  We will call you with abnormal results that need further treatment Push fluids and get plenty of rest Take pyridium as needed for symptomatic relief Follow up with PCP if symptoms persists Return here or go to ER if you have any new or worsening symptoms such as fever, worsening abdominal pain, nausea/vomiting, flank pain  Outlined signs and symptoms indicating need for more acute intervention Patient verbalized understanding After Visit Summary given     Moshe Cipro, NP 05/30/20 1937

## 2020-05-31 ENCOUNTER — Telehealth: Payer: Self-pay | Admitting: Emergency Medicine

## 2020-05-31 NOTE — Telephone Encounter (Signed)
Call from pt regarding U/A results in my chart - urine culture is pending/in process. RN explained that results pt was viewing in My Chart were the results from the UA done at Urgent Care last night. RN confirmed w/ pt that she would receive a phone call from the UC call back RN if urine culture was positive. Pt verbalized an understanding - no other questions at this time

## 2020-06-01 LAB — URINE CULTURE
MICRO NUMBER:: 11646535
SPECIMEN QUALITY:: ADEQUATE

## 2020-06-26 ENCOUNTER — Other Ambulatory Visit: Payer: Self-pay | Admitting: Osteopathic Medicine

## 2020-06-27 ENCOUNTER — Encounter: Payer: Self-pay | Admitting: Osteopathic Medicine

## 2020-06-27 ENCOUNTER — Other Ambulatory Visit (HOSPITAL_COMMUNITY)
Admission: RE | Admit: 2020-06-27 | Discharge: 2020-06-27 | Disposition: A | Discharge status: Home / Self Care | Payer: BC Managed Care – PPO | Source: Ambulatory Visit | Attending: Osteopathic Medicine | Admitting: Osteopathic Medicine

## 2020-06-27 ENCOUNTER — Other Ambulatory Visit: Payer: Self-pay | Admitting: Osteopathic Medicine

## 2020-06-27 ENCOUNTER — Other Ambulatory Visit: Payer: Self-pay

## 2020-06-27 ENCOUNTER — Ambulatory Visit: Payer: BC Managed Care – PPO | Admitting: Osteopathic Medicine

## 2020-06-27 VITALS — BP 105/67 | HR 68 | Wt 159.0 lb

## 2020-06-27 DIAGNOSIS — Z124 Encounter for screening for malignant neoplasm of cervix: Secondary | ICD-10-CM | POA: Diagnosis not present

## 2020-06-27 DIAGNOSIS — Z0000 Encounter for general adult medical examination without abnormal findings: Secondary | ICD-10-CM | POA: Insufficient documentation

## 2020-06-27 DIAGNOSIS — Z1231 Encounter for screening mammogram for malignant neoplasm of breast: Secondary | ICD-10-CM

## 2020-06-27 DIAGNOSIS — I10 Essential (primary) hypertension: Secondary | ICD-10-CM

## 2020-06-27 DIAGNOSIS — Z1211 Encounter for screening for malignant neoplasm of colon: Secondary | ICD-10-CM

## 2020-06-27 DIAGNOSIS — E119 Type 2 diabetes mellitus without complications: Secondary | ICD-10-CM | POA: Diagnosis not present

## 2020-06-27 DIAGNOSIS — E785 Hyperlipidemia, unspecified: Secondary | ICD-10-CM

## 2020-06-27 DIAGNOSIS — L409 Psoriasis, unspecified: Secondary | ICD-10-CM

## 2020-06-27 MED ORDER — VALACYCLOVIR HCL 1 G PO TABS: ORAL_TABLET | ORAL | 2 refills | Status: DC

## 2020-06-27 MED ORDER — METFORMIN HCL 1000 MG PO TABS: ORAL_TABLET | ORAL | 3 refills | Status: DC

## 2020-06-27 MED ORDER — PRAVASTATIN SODIUM 20 MG PO TABS: 20.0000 mg | ORAL_TABLET | Freq: Every day | ORAL | 3 refills | Status: DC

## 2020-06-27 MED ORDER — TRAZODONE HCL 100 MG PO TABS: ORAL_TABLET | ORAL | 0 refills | Status: DC

## 2020-06-27 MED ORDER — DOXYCYCLINE HYCLATE 100 MG PO CAPS: 100.0000 mg | ORAL_CAPSULE | Freq: Two times a day (BID) | ORAL | 0 refills | Status: DC

## 2020-06-27 MED ORDER — FLUOCINOLONE ACETONIDE 0.01 % EX SOLN: Freq: Two times a day (BID) | CUTANEOUS | 0 refills | Status: DC

## 2020-06-27 MED ORDER — LISINOPRIL 10 MG PO TABS: 1.0000 | ORAL_TABLET | Freq: Every day | ORAL | 3 refills | Status: DC

## 2020-06-27 NOTE — Progress Notes (Signed)
Barbara Lloyd is a 55 y.o. female who presents to  [facility] at [facility]  today, 06/27/20, seeking care for the following:  . Annual physical      ASSESSMENT & PLAN with other pertinent findings:  The primary encounter diagnosis was Annual physical exam. Diagnoses of Essential hypertension, Type 2 diabetes mellitus without complication, without long-term current use of insulin (HCC), Hyperlipidemia, unspecified hyperlipidemia type, Colon cancer screening, Encounter for screening mammogram for malignant neoplasm of breast, Cervical cancer screening, and Psoriasis were also pertinent to this visit.    Patient Instructions  General Preventive Care  Most recent routine screening labs: ordered.   Blood pressure goal 130/80 or less.   Tobacco: don't!   Alcohol: responsible moderation is ok for most adults - if you have concerns about your alcohol intake, please talk to me!   Exercise: as tolerated to reduce risk of cardiovascular disease and diabetes. Strength training will also prevent osteoporosis.   Mental health: if need for mental health care (medicines, counseling, other), or concerns about moods, please let me know!   Sexual / Reproductive health: if need for STD testing, or if concerns with libido/pain problems, please let me know!  Advanced Directive: Living Will and/or Healthcare Power of Attorney recommended for all adults, regardless of age or health.  Vaccines  Flu vaccine: for almost everyone, every fall.   Shingles vaccine: after age 32.   Pneumonia vaccines: booster after age 67.   Tetanus booster: every 10 years / 3rd trimester of pregnancy. Due 2026.   COVID vaccine: vaccine and boosters recommended!  Cancer screenings   Colon cancer screening: Cologuard ordered.   Breast cancer screening: mammogram annually age 77-75  Cervical cancer screening: Pap due! If normal, repeat in 5 years.   Lung cancer  screening: CT chest every year for those aged 73 to 80 years who have a 20 pack-year smoking history and currently smoke or have quit within the past 15 years  Infection screenings  . HIV: recommended screening at least once age 55-65 . Gonorrhea/Chlamydia: screening as needed . Hepatitis C: recommended once for everyone age 16-75 . TB: certain at-risk populations, or depending on work requirements and/or travel history Other . Bone Density Test: recommended for women at age 8   Orders Placed This Encounter  Procedures  . MM 3D SCREEN BREAST BILATERAL  . HgB A1c  . COMPLETE METABOLIC PANEL WITH GFR  . Lipid Panel w/reflex Direct LDL  . CBC  . Cologuard    Meds ordered this encounter  Medications  . traZODone (DESYREL) 100 MG tablet    Sig: TAKE 1/2 TO 1 TABLET BY MOUTH AT BEDTIME AS NEEDED FOR SLEEP    Dispense:  90 tablet    Refill:  0  . DISCONTD: valACYclovir (VALTREX) 1000 MG tablet    Sig: Take 2 tabs PO Q12hr for one day    Dispense:  4 tablet    Refill:  2  . lisinopril (ZESTRIL) 10 MG tablet    Sig: Take 1 tablet (10 mg total) by mouth daily.    Dispense:  90 tablet    Refill:  3  . metFORMIN (GLUCOPHAGE) 1000 MG tablet    Sig: TAKE 1 TABLET BY MOUTH EVERY DAY WITH BREAKFAST    Dispense:  90 tablet    Refill:  3  . pravastatin (PRAVACHOL) 20 MG tablet    Sig: Take 1 tablet (20 mg total) by mouth daily.  Dispense:  90 tablet    Refill:  3  . fluocinolone (SYNALAR) 0.01 % external solution    Sig: Apply topically 2 (two) times daily. Prn psoriasis    Dispense:  60 mL    Refill:  0  . doxycycline (VIBRAMYCIN) 100 MG capsule    Sig: Take 1 capsule (100 mg total) by mouth 2 (two) times daily for 7 days. As needed for hidradenitis flare    Dispense:  14 capsule    Refill:  0  . valACYclovir (VALTREX) 1000 MG tablet    Sig: Take 2 tabs PO Q12hr for one day    Dispense:  4 tablet    Refill:  2     See below for relevant physical exam findings  See below  for recent lab and imaging results reviewed  Medications, allergies, PMH, PSH, SocH, FamH reviewed below    Follow-up instructions: Return for annual in one year. Diabetes / A1C follow-up depending on A1C results w/ labs .                                        Exam:  BP 105/67   Pulse 68   Wt 159 lb (72.1 kg)   SpO2 99%   BMI 22.81 kg/m   Constitutional: VS see above. General Appearance: alert, well-developed, well-nourished, NAD  Neck: No masses, trachea midline.   Respiratory: Normal respiratory effort. no wheeze, no rhonchi, no rales  Cardiovascular: S1/S2 normal, no murmur, no rub/gallop auscultated. RRR.   Musculoskeletal: Gait normal. Symmetric and independent movement of all extremities  Abdominal: non-tender, non-distended, no appreciable organomegaly, neg Murphy's, BS WNLx4  Neurological: Normal balance/coordination. No tremor.  Skin: warm, dry, intact.   Psychiatric: Normal judgment/insight. Normal mood and affect. Oriented x3.  GYN: No lesions/ulcers to external genitalia other than small sebaceous cyst X2 one on R LM, one on L LM, normal urethra, normal vaginal mucosa, physiologic discharge, cervix normal without lesions, uterus not enlarged or tender, adnexa no masses and nontender  BREAST: No rashes/skin changes, normal fibrous breast tissue, no masses or tenderness, normal nipple without discharge, normal axilla  Current Meds  Medication Sig  . [DISCONTINUED] lisinopril (ZESTRIL) 10 MG tablet TAKE 1 TABLET BY MOUTH EVERY DAY  . [DISCONTINUED] metFORMIN (GLUCOPHAGE) 1000 MG tablet TAKE 1 TABLET BY MOUTH EVERY DAY WITH BREAKFAST  . [DISCONTINUED] pravastatin (PRAVACHOL) 20 MG tablet TAKE 1 TABLET BY MOUTH EVERY DAY  . [DISCONTINUED] traZODone (DESYREL) 100 MG tablet Take 0.5-1 tablets (50-100 mg total) by mouth at bedtime as needed for sleep. appt for refills  . [DISCONTINUED] valACYclovir (VALTREX) 1000 MG tablet Take 2  tabs PO Q12hr for one day    Allergies  Allergen Reactions  . Ciprofloxacin Rash  . Sulfa Antibiotics Rash    Patient Active Problem List   Diagnosis Date Noted  . Hidradenitis suppurativa 11/09/2019  . History of tubal ligation 03/31/2019  . History of endometrial ablation 03/31/2019  . Adopted 03/31/2019  . Influenza vaccination declined 03/31/2019  . Hyperlipidemia 03/28/2016  . Type 2 diabetes mellitus without complication, without long-term current use of insulin (HCC) 03/27/2016  . Essential hypertension 08/16/2015  . Dental caries 08/16/2015  . BP (high blood pressure) 08/16/2015    Family History  Adopted: Yes    Social History   Tobacco Use  Smoking Status Current Every Day Smoker  . Packs/day: 0.50  .  Years: 25.00  . Pack years: 12.50  . Types: Cigarettes  . Last attempt to quit: 04/06/2020  . Years since quitting: 0.2  Smokeless Tobacco Never Used    Past Surgical History:  Procedure Laterality Date  . CESAREAN SECTION     x2  . ENDOMETRIAL ABLATION     2006  . FOOT SURGERY    . GANGLION CYST EXCISION      Immunization History  Administered Date(s) Administered  . PFIZER(Purple Top)SARS-COV-2 Vaccination 10/26/2019  . Pneumococcal Polysaccharide-23 08/15/2013  . Tdap 03/19/2006, 08/16/2011, 03/23/2014    Recent Results (from the past 2160 hour(s))  POCT urinalysis dipstick     Status: Abnormal   Collection Time: 05/30/20  7:22 PM  Result Value Ref Range   Color, UA orange (A) yellow    Comment: Took 2 Azo; color may impact urine results   Clarity, UA clear clear   Glucose, UA =100 (A) negative mg/dL   Bilirubin, UA negative negative   Ketones, POC UA negative negative mg/dL   Spec Grav, UA 6.734 1.937 - 1.025   Blood, UA negative negative   pH, UA 5.0 5.0 - 8.0   Protein Ur, POC trace (A) negative mg/dL   Urobilinogen, UA 1.0 0.2 or 1.0 E.U./dL   Nitrite, UA Positive (A) Negative   Leukocytes, UA Negative Negative  Urine culture      Status: None   Collection Time: 05/30/20  7:44 PM   Specimen: Urine, Clean Catch  Result Value Ref Range   MICRO NUMBER: [id number]    SPECIMEN QUALITY: Adequate    Sample Source URINE, CLEAN CATCH    STATUS: FINAL    ISOLATE 1:      Mixed genital flora isolated. These superficial bacteria are not indicative of a urinary tract infection. No further organism identification is warranted on this specimen. If clinically indicated, recollect clean-catch, mid-stream urine and transfer  immediately to Urine Culture Transport Tube.     No results found.     All questions at time of visit were answered - patient instructed to contact office with any additional concerns or updates. ER/RTC precautions were reviewed with the patient as applicable.   Please note: manual typing as well as voice recognition software may have been used to produce this document - typos may escape review. Please contact Dr. Lyn Hollingshead for any needed clarifications.

## 2020-06-27 NOTE — Patient Instructions (Addendum)
General Preventive Care  Most recent routine screening labs: ordered.   Blood pressure goal 130/80 or less.   Tobacco: don't!   Alcohol: responsible moderation is ok for most adults - if you have concerns about your alcohol intake, please talk to me!   Exercise: as tolerated to reduce risk of cardiovascular disease and diabetes. Strength training will also prevent osteoporosis.   Mental health: if need for mental health care (medicines, counseling, other), or concerns about moods, please let me know!   Sexual / Reproductive health: if need for STD testing, or if concerns with libido/pain problems, please let me know!  Advanced Directive: Living Will and/or Healthcare Power of Attorney recommended for all adults, regardless of age or health.  Vaccines  Flu vaccine: for almost everyone, every fall.   Shingles vaccine: after age 55.   Pneumonia vaccines: booster after age 31.   Tetanus booster: every 10 years / 3rd trimester of pregnancy. Due 2026.   COVID vaccine: vaccine and boosters recommended!  Cancer screenings   Colon cancer screening: Cologuard ordered.   Breast cancer screening: mammogram annually age 49-75  Cervical cancer screening: Pap due! If normal, repeat in 5 years.   Lung cancer screening: CT chest every year for those aged 21 to 80 years who have a 20 pack-year smoking history and currently smoke or have quit within the past 15 years  Infection screenings  . HIV: recommended screening at least once age 90-65 . Gonorrhea/Chlamydia: screening as needed . Hepatitis C: recommended once for everyone age 49-75 . TB: certain at-risk populations, or depending on work requirements and/or travel history Other . Bone Density Test: recommended for women at age 29

## 2020-06-29 ENCOUNTER — Ambulatory Visit: Payer: BC Managed Care – PPO

## 2020-06-30 DIAGNOSIS — E119 Type 2 diabetes mellitus without complications: Secondary | ICD-10-CM | POA: Diagnosis not present

## 2020-06-30 DIAGNOSIS — Z0000 Encounter for general adult medical examination without abnormal findings: Secondary | ICD-10-CM | POA: Diagnosis not present

## 2020-06-30 DIAGNOSIS — I10 Essential (primary) hypertension: Secondary | ICD-10-CM | POA: Diagnosis not present

## 2020-06-30 DIAGNOSIS — E785 Hyperlipidemia, unspecified: Secondary | ICD-10-CM | POA: Diagnosis not present

## 2020-06-30 LAB — CYTOLOGY - PAP
Adequacy: ABSENT
Comment: NEGATIVE
Diagnosis: NEGATIVE
High risk HPV: NEGATIVE

## 2020-07-01 LAB — COMPLETE METABOLIC PANEL WITH GFR
AG Ratio: 1.8 (calc) (ref 1.0–2.5)
ALT: 13 U/L (ref 6–29)
AST: 12 U/L (ref 10–35)
Albumin: 4.6 g/dL (ref 3.6–5.1)
Alkaline phosphatase (APISO): 70 U/L (ref 37–153)
BUN: 18 mg/dL (ref 7–25)
CO2: 27 mmol/L (ref 20–32)
Calcium: 9.5 mg/dL (ref 8.6–10.4)
Chloride: 105 mmol/L (ref 98–110)
Creat: 0.65 mg/dL (ref 0.50–1.05)
GFR, Est African American: 117 mL/min/{1.73_m2} (ref >=60)
GFR, Est Non African American: 101 mL/min/{1.73_m2} (ref >=60)
Globulin: 2.6 g/dL (calc) (ref 1.9–3.7)
Glucose, Bld: 111 mg/dL — ABNORMAL HIGH (ref 65–99)
Potassium: 4.5 mmol/L (ref 3.5–5.3)
Sodium: 140 mmol/L (ref 135–146)
Total Bilirubin: 0.6 mg/dL (ref 0.2–1.2)
Total Protein: 7.2 g/dL (ref 6.1–8.1)

## 2020-07-01 LAB — LIPID PANEL W/REFLEX DIRECT LDL
Cholesterol: 182 mg/dL (ref <200)
HDL: 44 mg/dL — ABNORMAL LOW (ref >=50)
LDL Cholesterol (Calc): 117 mg/dL (calc) — ABNORMAL HIGH
Non-HDL Cholesterol (Calc): 138 mg/dL (calc) — ABNORMAL HIGH (ref <130)
Total CHOL/HDL Ratio: 4.1 (calc) (ref <5.0)
Triglycerides: 104 mg/dL (ref <150)

## 2020-07-01 LAB — CBC
HCT: 42.4 % (ref 35.0–45.0)
Hemoglobin: 13.8 g/dL (ref 11.7–15.5)
MCH: 29.2 pg (ref 27.0–33.0)
MCHC: 32.5 g/dL (ref 32.0–36.0)
MCV: 89.6 fL (ref 80.0–100.0)
MPV: 11 fL (ref 7.5–12.5)
Platelets: 327 10*3/uL (ref 140–400)
RBC: 4.73 10*6/uL (ref 3.80–5.10)
RDW: 12.5 % (ref 11.0–15.0)
WBC: 7.4 10*3/uL (ref 3.8–10.8)

## 2020-07-01 LAB — HEMOGLOBIN A1C
Hgb A1c MFr Bld: 6.8 % of total Hgb — ABNORMAL HIGH (ref <5.7)
Mean Plasma Glucose: 148 mg/dL
eAG (mmol/L): 8.2 mmol/L

## 2020-07-20 ENCOUNTER — Other Ambulatory Visit: Payer: Self-pay

## 2020-07-20 ENCOUNTER — Ambulatory Visit (INDEPENDENT_AMBULATORY_CARE_PROVIDER_SITE_OTHER): Payer: BC Managed Care – PPO

## 2020-07-20 DIAGNOSIS — Z1231 Encounter for screening mammogram for malignant neoplasm of breast: Secondary | ICD-10-CM | POA: Diagnosis not present

## 2020-07-21 ENCOUNTER — Other Ambulatory Visit: Payer: Self-pay | Admitting: Osteopathic Medicine

## 2020-07-21 DIAGNOSIS — R928 Other abnormal and inconclusive findings on diagnostic imaging of breast: Secondary | ICD-10-CM

## 2020-08-02 ENCOUNTER — Ambulatory Visit
Admission: RE | Admit: 2020-08-02 | Discharge: 2020-08-02 | Disposition: A | Discharge status: Home / Self Care | Payer: BC Managed Care – PPO | Source: Ambulatory Visit | Attending: Osteopathic Medicine | Admitting: Osteopathic Medicine

## 2020-08-02 ENCOUNTER — Other Ambulatory Visit: Payer: Self-pay

## 2020-08-02 DIAGNOSIS — R928 Other abnormal and inconclusive findings on diagnostic imaging of breast: Secondary | ICD-10-CM

## 2020-08-02 DIAGNOSIS — Z1239 Encounter for other screening for malignant neoplasm of breast: Secondary | ICD-10-CM | POA: Diagnosis not present

## 2020-10-06 ENCOUNTER — Other Ambulatory Visit: Payer: Self-pay | Admitting: Osteopathic Medicine

## 2020-10-06 DIAGNOSIS — I10 Essential (primary) hypertension: Secondary | ICD-10-CM

## 2020-10-06 DIAGNOSIS — E119 Type 2 diabetes mellitus without complications: Secondary | ICD-10-CM

## 2020-11-04 ENCOUNTER — Other Ambulatory Visit: Payer: Self-pay | Admitting: Osteopathic Medicine

## 2020-11-16 ENCOUNTER — Encounter: Payer: Self-pay | Admitting: Family Medicine

## 2020-12-09 ENCOUNTER — Other Ambulatory Visit: Payer: Self-pay | Admitting: Osteopathic Medicine

## 2020-12-12 NOTE — Telephone Encounter (Signed)
Decline, appointment would be needed for further antibiotics she will also need a new PCP.

## 2020-12-12 NOTE — Telephone Encounter (Signed)
Routing to covering provider. Rx not listed in active med list.  °

## 2020-12-13 NOTE — Telephone Encounter (Signed)
Patient has appointment scheduled with Joy to transfer care on October 11th, 22. AM

## 2020-12-13 NOTE — Telephone Encounter (Signed)
Please contact the patient to schedule an appointment to transition care to new provider. Thanks in advance.

## 2020-12-27 ENCOUNTER — Ambulatory Visit: Payer: BC Managed Care – PPO | Admitting: Medical-Surgical

## 2021-01-17 ENCOUNTER — Ambulatory Visit: Payer: BC Managed Care – PPO | Admitting: Medical-Surgical

## 2021-01-25 ENCOUNTER — Encounter: Payer: Self-pay | Admitting: Medical-Surgical

## 2021-01-25 ENCOUNTER — Other Ambulatory Visit: Payer: Self-pay

## 2021-01-25 ENCOUNTER — Other Ambulatory Visit: Payer: Self-pay | Admitting: Medical-Surgical

## 2021-01-25 ENCOUNTER — Ambulatory Visit (INDEPENDENT_AMBULATORY_CARE_PROVIDER_SITE_OTHER): Payer: 59 | Admitting: Medical-Surgical

## 2021-01-25 VITALS — BP 121/70 | HR 76 | Resp 20 | Wt 160.0 lb

## 2021-01-25 DIAGNOSIS — Z7689 Persons encountering health services in other specified circumstances: Secondary | ICD-10-CM

## 2021-01-25 DIAGNOSIS — I10 Essential (primary) hypertension: Secondary | ICD-10-CM | POA: Diagnosis not present

## 2021-01-25 DIAGNOSIS — E785 Hyperlipidemia, unspecified: Secondary | ICD-10-CM | POA: Diagnosis not present

## 2021-01-25 DIAGNOSIS — B001 Herpesviral vesicular dermatitis: Secondary | ICD-10-CM

## 2021-01-25 DIAGNOSIS — L409 Psoriasis, unspecified: Secondary | ICD-10-CM

## 2021-01-25 DIAGNOSIS — F5101 Primary insomnia: Secondary | ICD-10-CM | POA: Insufficient documentation

## 2021-01-25 DIAGNOSIS — L732 Hidradenitis suppurativa: Secondary | ICD-10-CM

## 2021-01-25 DIAGNOSIS — E119 Type 2 diabetes mellitus without complications: Secondary | ICD-10-CM | POA: Diagnosis not present

## 2021-01-25 LAB — POCT GLYCOSYLATED HEMOGLOBIN (HGB A1C): HbA1c, POC (controlled diabetic range): 7 % (ref 0.0–7.0)

## 2021-01-25 MED ORDER — LISINOPRIL 10 MG PO TABS
10.0000 mg | ORAL_TABLET | Freq: Every day | ORAL | 1 refills | Status: DC
Start: 2021-01-25 — End: 2021-07-10

## 2021-01-25 MED ORDER — DERMA-SMOOTHE/FS BODY 0.01 % EX OIL
1.0000 | TOPICAL_OIL | Freq: Two times a day (BID) | CUTANEOUS | 3 refills | Status: DC | PRN
Start: 2021-01-25 — End: 2021-01-25

## 2021-01-25 MED ORDER — DOXYCYCLINE HYCLATE 100 MG PO CAPS: 100.0000 mg | ORAL_CAPSULE | Freq: Two times a day (BID) | ORAL | 0 refills | Status: AC

## 2021-01-25 MED ORDER — VALACYCLOVIR HCL 1 G PO TABS: ORAL_TABLET | ORAL | 2 refills | Status: DC

## 2021-01-25 MED ORDER — TRAZODONE HCL 100 MG PO TABS: 50.0000 mg | ORAL_TABLET | Freq: Every evening | ORAL | 3 refills | Status: DC | PRN

## 2021-01-25 MED ORDER — METFORMIN HCL 1000 MG PO TABS
1000.0000 mg | ORAL_TABLET | Freq: Every day | ORAL | 1 refills | Status: DC
Start: 2021-01-25 — End: 2021-07-10

## 2021-01-25 MED ORDER — PRAVASTATIN SODIUM 20 MG PO TABS: 20.0000 mg | ORAL_TABLET | Freq: Every day | ORAL | 1 refills | Status: DC

## 2021-01-25 NOTE — Telephone Encounter (Signed)
Needs PA. Thanks.

## 2021-01-25 NOTE — Progress Notes (Signed)
HPI with pertinent ROS:   CC: Transfer of care, chronic disease follow-up  HPI: Pleasant 55 year old female presenting today to transfer care to a new PCP and to follow-up on:  Diabetes- not checking sugars at home. Has been eating more sweets lately due to increased stress with job changes and more time at home. Stays very active with her new job. Taking Metformin 1,000mg  daily with breakfast, tolerating well without side effects.   Hypertension- not checking BP at home. Taking Lisinopril 10mg  daily, tolerating well without side effects. Denies CP, SOB, palpitations, lower extremity edema, dizziness, headaches, or vision changes.  Hyperlipidemia- taking Pravastatin 20mg  daily, tolerating well without side effects.   Hydradenitis suppurativa- mainly affected in the groin and has only intermittent issues. Uses doxycycline as needed for a couple of days which clears up the lesions before they become significant abscesses  Psoriasis- mainly affects her scalp just behind her right ear but does spread to the occipital area at times. Using Derma-smoothe topically as prescribed.   Cold sores- has periodic issues with cold sores. Takes Valtrex with good resolution. Would like a refill.   Insomnia- taking Trazodone 100mg  nightly prn with good effectiveness.   I reviewed the past medical history, family history, social history, surgical history, and allergies today and no changes were needed.  Please see the problem list section below in epic for further details.   Physical exam:   General: Well Developed, well nourished, and in no acute distress.  Neuro: Alert and oriented x3.  HEENT: Normocephalic, atraumatic.  Skin: Warm and dry. Cardiac: Regular rate and rhythm, no murmurs rubs or gallops, no lower extremity edema.  Respiratory: Clear to auscultation bilaterally. Not using accessory muscles, speaking in full sentences.  Impression and Recommendations:    1. Encounter to establish  care Reviewed available information and discussed care concerns with patient.   2. Type 2 diabetes mellitus without complication, without long-term current use of insulin (HCC) POCT hgbA1c 7.0% today, up from 6.8% about 6 months ago. Checking labs today. Foot exam completed. Discussed current treatment. Continue with Metformin 1,000mg  daily. Discussed diabetic diet adherence and reduction of sweets and snacking.  - POCT glycosylated hemoglobin (Hb A1C) - CBC with Differential/Platelet - COMPLETE METABOLIC PANEL WITH GFR - Lipid panel - HM Diabetes Foot Exam - metFORMIN (GLUCOPHAGE) 1000 MG tablet; Take 1 tablet (1,000 mg total) by mouth daily with breakfast.  Dispense: 90 tablet; Refill: 1  3. Essential hypertension Checking labs. BP at goal. Continue Lisinopril 10mg  daily.  - CBC with Differential/Platelet - COMPLETE METABOLIC PANEL WITH GFR  4. Hyperlipidemia, unspecified hyperlipidemia type  Checking Lipids. Continue Pravastatin 20mg  daily.  - Lipid panel - pravastatin (PRAVACHOL) 20 MG tablet; Take 1 tablet (20 mg total) by mouth daily.  Dispense: 90 tablet; Refill: 1  5. Hidradenitis suppurativa Continue Doxycycline on a prn basis for groin lesions.   6. Psoriasis Refilling Derma-smoothe brand name. May benefit from topical vitamin E to the irritated area in between steroid applications.  - DERMA-SMOOTHE/FS BODY 0.01 % OIL; Apply 1 application topically 2 (two) times daily as needed.  Dispense: 118 mL; Refill: 3  9. Cold sore Refilling Valtrex to use prn.  - valACYclovir (VALTREX) 1000 MG tablet; Take 2 tabs PO Q12hr for one day  Dispense: 4 tablet; Refill: 2  10. Primary insomnia Continue Trazodone 50-100mg  at bedtime as needed.  - traZODone (DESYREL) 100 MG tablet; Take 0.5-1 tablets (50-100 mg total) by mouth at bedtime as needed. for sleep  Dispense: 90 tablet; Refill: 3  Return in about 3 months (around 04/27/2021) for DM/HTN/HLD follow  up. ___________________________________________ Thayer Ohm, DNP, APRN, FNP-BC Primary Care and Sports Medicine [facility]

## 2021-01-26 LAB — CBC WITH DIFFERENTIAL/PLATELET
Absolute Monocytes: 569 cells/uL (ref 200–950)
Basophils Absolute: 87 cells/uL (ref 0–200)
Basophils Relative: 1.1 %
Eosinophils Absolute: 292 cells/uL (ref 15–500)
Eosinophils Relative: 3.7 %
HCT: 42.6 % (ref 35.0–45.0)
Hemoglobin: 13.9 g/dL (ref 11.7–15.5)
Lymphs Abs: 2520 cells/uL (ref 850–3900)
MCH: 29.6 pg (ref 27.0–33.0)
MCHC: 32.6 g/dL (ref 32.0–36.0)
MCV: 90.6 fL (ref 80.0–100.0)
MPV: 11.6 fL (ref 7.5–12.5)
Monocytes Relative: 7.2 %
Neutro Abs: 4432 cells/uL (ref 1500–7800)
Neutrophils Relative %: 56.1 %
Platelets: 308 10*3/uL (ref 140–400)
RBC: 4.7 10*6/uL (ref 3.80–5.10)
RDW: 12.4 % (ref 11.0–15.0)
Total Lymphocyte: 31.9 %
WBC: 7.9 10*3/uL (ref 3.8–10.8)

## 2021-01-26 LAB — COMPLETE METABOLIC PANEL WITH GFR
AG Ratio: 1.6 (calc) (ref 1.0–2.5)
ALT: 16 U/L (ref 6–29)
AST: 14 U/L (ref 10–35)
Albumin: 4.4 g/dL (ref 3.6–5.1)
Alkaline phosphatase (APISO): 77 U/L (ref 37–153)
BUN: 17 mg/dL (ref 7–25)
CO2: 24 mmol/L (ref 20–32)
Calcium: 9.9 mg/dL (ref 8.6–10.4)
Chloride: 104 mmol/L (ref 98–110)
Creat: 0.59 mg/dL (ref 0.50–1.03)
Globulin: 2.7 g/dL (calc) (ref 1.9–3.7)
Glucose, Bld: 97 mg/dL (ref 65–139)
Potassium: 4.4 mmol/L (ref 3.5–5.3)
Sodium: 140 mmol/L (ref 135–146)
Total Bilirubin: 0.4 mg/dL (ref 0.2–1.2)
Total Protein: 7.1 g/dL (ref 6.1–8.1)
eGFR: 106 mL/min/{1.73_m2} (ref >=60)

## 2021-01-26 LAB — LIPID PANEL
Cholesterol: 161 mg/dL (ref <200)
HDL: 41 mg/dL — ABNORMAL LOW (ref >=50)
LDL Cholesterol (Calc): 88 mg/dL (calc)
Non-HDL Cholesterol (Calc): 120 mg/dL (calc) (ref <130)
Total CHOL/HDL Ratio: 3.9 (calc) (ref <5.0)
Triglycerides: 233 mg/dL — ABNORMAL HIGH (ref <150)

## 2021-04-27 ENCOUNTER — Encounter: Payer: Self-pay | Admitting: Medical-Surgical

## 2021-04-27 ENCOUNTER — Ambulatory Visit (INDEPENDENT_AMBULATORY_CARE_PROVIDER_SITE_OTHER): Payer: 59 | Admitting: Medical-Surgical

## 2021-04-27 ENCOUNTER — Other Ambulatory Visit: Payer: Self-pay

## 2021-04-27 VITALS — BP 113/68 | HR 80 | Resp 20 | Ht 70.0 in | Wt 160.1 lb

## 2021-04-27 DIAGNOSIS — E785 Hyperlipidemia, unspecified: Secondary | ICD-10-CM | POA: Diagnosis not present

## 2021-04-27 DIAGNOSIS — B001 Herpesviral vesicular dermatitis: Secondary | ICD-10-CM | POA: Diagnosis not present

## 2021-04-27 DIAGNOSIS — E119 Type 2 diabetes mellitus without complications: Secondary | ICD-10-CM

## 2021-04-27 DIAGNOSIS — I10 Essential (primary) hypertension: Secondary | ICD-10-CM | POA: Diagnosis not present

## 2021-04-27 DIAGNOSIS — R202 Paresthesia of skin: Secondary | ICD-10-CM

## 2021-04-27 LAB — POCT GLYCOSYLATED HEMOGLOBIN (HGB A1C): HbA1c, POC (controlled diabetic range): 6.6 % (ref 0.0–7.0)

## 2021-04-27 NOTE — Progress Notes (Signed)
°  HPI with pertinent ROS:   CC: DM follow up  HPI: Pleasant 56 year old female presenting today for follow up on:  DM- not checking sugars at home but does have a glucometer and can start checking them if needed. Working on dietary changes to avoid junk food. Eating more veggies and healthy snacks. Walking 5-6 miles daily at work. Taking Metformin 1000mg  daily, tolerating well without side effects.   HTN- taking Lisinopril 10mg  daily, tolerating well without side effects. Low sodium diet. Not monitoring  BP at home. Denies CP, SOB, palpitations, lower extremity edema, dizziness, headaches, or vision changes.  HLD- taking pravastatin 20mg  daily, tolerating well without side effects. Low fat diet.   Cold sore- using Valtrex as needed. Just completed the one day dosing for a current sore on her left lower lip.   I reviewed the past medical history, family history, social history, surgical history, and allergies today and no changes were needed.  Please see the problem list section below in epic for further details.   Physical exam:   General: Well Developed, well nourished, and in no acute distress.  Neuro: Alert and oriented x3.  HEENT: Normocephalic, atraumatic.  Skin: Warm and dry. Cardiac: Regular rate and rhythm, no murmurs rubs or gallops, no lower extremity edema.  Respiratory: Clear to auscultation bilaterally. Not using accessory muscles, speaking in full sentences.  Impression and Recommendations:    1. Type 2 diabetes mellitus without complication, without long-term current use of insulin (HCC) Hemoglobin A1c 6.6% today down from 7.0% continue metformin 1000 mg daily.  Continue dietary and lifestyle modifications. - POCT glycosylated hemoglobin (Hb A1C)  2. Essential hypertension Pressure at goal today.  Continue lisinopril 10 mg daily.  3. Hyperlipidemia, unspecified hyperlipidemia type Continue pravastatin 20 mg daily.  4. Cold sore Continue Valtrex as needed.  5.   Paresthesias Unclear etiology.  Possibly related to diabetes versus peripheral vascular concerns.  Exam showing good pulses with brisk cap refill.  Feet warm with sensation intact to monofilament testing.  Discussed possibly checking electrolytes and for vitamin deficiencies but she would like to wait on this.  Return in about 6 months (around 10/25/2021) for DM/HTN/HLD follow up. ___________________________________________ , DNP, APRN, FNP-BC Primary Care and Sports Medicine [facility]

## 2021-06-05 ENCOUNTER — Other Ambulatory Visit: Payer: Self-pay | Admitting: Medical-Surgical

## 2021-06-05 DIAGNOSIS — F5101 Primary insomnia: Secondary | ICD-10-CM

## 2021-07-06 LAB — HM DIABETES EYE EXAM

## 2021-07-09 ENCOUNTER — Other Ambulatory Visit: Payer: Self-pay | Admitting: Medical-Surgical

## 2021-07-09 DIAGNOSIS — I10 Essential (primary) hypertension: Secondary | ICD-10-CM

## 2021-07-09 DIAGNOSIS — E119 Type 2 diabetes mellitus without complications: Secondary | ICD-10-CM

## 2021-07-13 ENCOUNTER — Emergency Department (INDEPENDENT_AMBULATORY_CARE_PROVIDER_SITE_OTHER)
Admission: RE | Admit: 2021-07-13 | Discharge: 2021-07-13 | Disposition: A | Discharge status: Home / Self Care | Payer: 59 | Source: Ambulatory Visit

## 2021-07-13 ENCOUNTER — Other Ambulatory Visit: Payer: Self-pay

## 2021-07-13 VITALS — BP 116/77 | HR 83 | Temp 98.4°F | Resp 16 | Ht 70.0 in | Wt 160.0 lb

## 2021-07-13 DIAGNOSIS — R21 Rash and other nonspecific skin eruption: Secondary | ICD-10-CM

## 2021-07-13 DIAGNOSIS — H109 Unspecified conjunctivitis: Secondary | ICD-10-CM | POA: Diagnosis not present

## 2021-07-13 MED ORDER — AZASITE 1 % OP SOLN: OPHTHALMIC | 0 refills | Status: DC

## 2021-07-13 MED ORDER — METHYLPREDNISOLONE 4 MG PO TBPK: ORAL_TABLET | ORAL | 0 refills | Status: DC

## 2021-07-13 NOTE — Discharge Instructions (Addendum)
Instructed patient to instill eyedrops as directed advised patient if symptoms worsen and/or unresolved please follow-up with your optometry/ophthalmology for further evaluation.  Advised patient to take Medrol Dosepak as directed with food to completion.  Encouraged patient to increase daily water intake while taking these medications. ?

## 2021-07-13 NOTE — ED Triage Notes (Signed)
Left eye itches, hurts, burns x 2 days ?

## 2021-07-13 NOTE — ED Provider Notes (Signed)
?[place] ? ? ? ?CSN: [id number] ?Arrival date & time: 07/13/21  1353 ? ? ?  ? ?History   ?Chief Complaint ?Chief Complaint  ?Patient presents with  ? Eye Problem  ?  And a rash - Entered by patient  ? Rash  ? ? ?HPI ?Barbara Lloyd is a 56 y.o. female.  ? ?HPI 56 year old female presents with left eye itch, pain x2 days.  Patient currently is wearing glasses.  PMH significant for HTN and T2DM. ? ?Past Medical History:  ?Diagnosis Date  ? COVID-19 03/30/2020  ? Diabetes ([place])   ? High cholesterol   ? Hypertension   ? ? ?Patient Active Problem List  ? Diagnosis Date Noted  ? Psoriasis 01/25/2021  ? Cold sore 01/25/2021  ? Primary insomnia 01/25/2021  ? Hidradenitis suppurativa 11/09/2019  ? History of tubal ligation 03/31/2019  ? History of endometrial ablation 03/31/2019  ? Adopted 03/31/2019  ? Influenza vaccination declined 03/31/2019  ? Hyperlipidemia 03/28/2016  ? Type 2 diabetes mellitus without complication, without long-term current use of insulin ([place]) 03/27/2016  ? Essential hypertension 08/16/2015  ? Dental caries 08/16/2015  ? ? ?Past Surgical History:  ?Procedure Laterality Date  ? CESAREAN SECTION    ? x2  ? ENDOMETRIAL ABLATION    ? 2006  ? FOOT SURGERY    ? GANGLION CYST EXCISION    ? ? ?OB History   ?No obstetric history on file. ?  ? ? ? ?Home Medications   ? ?Prior to Admission medications   ?Medication Sig Start Date End Date Taking? Authorizing Provider  ?azithromycin (AZASITE) 1 % ophthalmic solution 1 gtt in left eye BID x 2 days, then QD x 5 days 07/13/21  Yes Eliezer Lofts, FNP  ?methylPREDNISolone (MEDROL DOSEPAK) 4 MG TBPK tablet Take as directed. 07/13/21  Yes Eliezer Lofts, FNP  ?Fluocinolone Acetonide Body 0.01 % OIL APPLY 1 APPLICATION TOPICALLY TWICE A DAY AS NEEDED 01/25/21   Samuel Bouche, NP  ?lisinopril (ZESTRIL) 10 MG tablet TAKE 1 TABLET BY MOUTH EVERY DAY 07/10/21   Samuel Bouche, NP  ?metFORMIN (GLUCOPHAGE) 1000 MG tablet TAKE 1 TABLET BY MOUTH EVERY DAY WITH BREAKFAST  07/10/21   Samuel Bouche, NP  ?pravastatin (PRAVACHOL) 20 MG tablet Take 1 tablet (20 mg total) by mouth daily. 01/25/21   Samuel Bouche, NP  ?traZODone (DESYREL) 100 MG tablet TAKE 0.5-1 TABLETS (50-100 MG TOTAL) BY MOUTH AT BEDTIME AS NEEDED. FOR SLEEP 06/05/21   Samuel Bouche, NP  ?valACYclovir (VALTREX) 1000 MG tablet Take 2 tabs PO Q12hr for one day 01/25/21   Samuel Bouche, NP  ? ? ?Family History ?Family History  ?Adopted: Yes  ? ? ?Social History ?Social History  ? ?Tobacco Use  ? Smoking status: Every Day  ?  Packs/day: 0.50  ?  Years: 25.00  ?  Pack years: 12.50  ?  Types: Cigarettes  ?  Last attempt to quit: 04/06/2020  ?  Years since quitting: 1.2  ? Smokeless tobacco: Never  ?Vaping Use  ? Vaping Use: Never used  ?Substance Use Topics  ? Alcohol use: Yes  ?  Alcohol/week: 3.0 standard drinks  ?  Types: 3 Standard drinks or equivalent per week  ?  Comment: occ  ? Drug use: No  ? ? ? ?Allergies   ?Ciprofloxacin and Sulfa antibiotics ? ? ?Review of Systems ?Review of Systems  ?Eyes:  Positive for pain and itching.  ?All other systems reviewed and are negative. ? ? ?Physical Exam ?  Triage Vital Signs ?ED Triage Vitals  ?Enc Vitals Group  ?   BP 07/13/21 1404 116/77  ?   Pulse Rate 07/13/21 1404 83  ?   Resp 07/13/21 1404 16  ?   Temp 07/13/21 1404 98.4 ?F (36.9 ?C)  ?   Temp Source 07/13/21 1404 Oral  ?   SpO2 07/13/21 1404 99 %  ?   Weight 07/13/21 1406 160 lb (72.6 kg)  ?   Height 07/13/21 1406 5\' 10"  (1.778 m)  ?   Head Circumference --   ?   Peak Flow --   ?   Pain Score 07/13/21 1406 6  ?   Pain Loc --   ?   Pain Edu? --   ?   Excl. in [place]? --   ? ?No data found. ? ?Updated Vital Signs ?BP 116/77 (BP Location: Left Arm)   Pulse 83   Temp 98.4 ?F (36.9 ?C) (Oral)   Resp 16   Ht 5\' 10"  (1.778 m)   Wt 160 lb (72.6 kg)   SpO2 99%   BMI 22.96 kg/m?  ? ?Physical Exam ?Vitals and nursing note reviewed.  ?Constitutional:   ?   Appearance: Normal appearance. She is normal weight.  ?HENT:  ?   Head: Normocephalic and  atraumatic.  ?   Mouth/Throat:  ?   Mouth: Mucous membranes are moist.  ?   Pharynx: Oropharynx is clear.  ?Eyes:  ?   General: No scleral icterus.    ?   Left eye: No discharge.  ?   Extraocular Movements: Extraocular movements intact.  ?   Conjunctiva/sclera: Conjunctivae normal.  ?   Pupils: Pupils are equal, round, and reactive to light.  ?   Comments: Left eye: Sclera with +3 injection  ?Cardiovascular:  ?   Rate and Rhythm: Normal rate and regular rhythm.  ?   Pulses: Normal pulses.  ?   Heart sounds: Normal heart sounds.  ?Pulmonary:  ?   Effort: Pulmonary effort is normal.  ?   Breath sounds: Normal breath sounds. No wheezing, rhonchi or rales.  ?Musculoskeletal:  ?   Cervical back: Normal range of motion and neck supple.  ?Skin: ?   General: Skin is warm and dry.  ?   Comments: Right forearm (dorsum): ~ 2.0 cm circular shaped raised erythematous maculopapular eruption, pruritic in nature per patient  ?Neurological:  ?   General: No focal deficit present.  ?   Mental Status: She is alert and oriented to person, place, and time. Mental status is at baseline.  ? ? ? ?UC Treatments / Results  ?Labs ?(all labs ordered are listed, but only abnormal results are displayed) ?Labs Reviewed - No data to display ? ?EKG ? ? ?Radiology ?No results found. ? ?Procedures ?Procedures (including critical care time) ? ?Medications Ordered in UC ?Medications - No data to display ? ?Initial Impression / Assessment and Plan / UC Course  ?I have reviewed the triage vital signs and the nursing notes. ? ?Pertinent labs & imaging results that were available during my care of the patient were reviewed by me and considered in my medical decision making (see chart for details). ? ?  ? ?MDM: 1.  Conjunctivitis of left eye, unspecified conjunctivitis type-Rx'd Asasite; 2.  Rash and nonspecific skin eruption-Rx'd Medrol Dosepak. Instructed patient to instill eyedrops as directed advised patient if symptoms worsen and/or unresolved please  follow-up with your optometry/ophthalmology for further evaluation.  Advised patient to take Medrol  Dosepak as directed with food to completion.  Encouraged patient to increase daily water intake while taking these medications.  Patient discharged home, hemodynamically stable. ?Final Clinical Impressions(s) / UC Diagnoses  ? ?Final diagnoses:  ?Conjunctivitis of left eye, unspecified conjunctivitis type  ?Rash and nonspecific skin eruption  ? ? ? ?Discharge Instructions   ? ?  ?Instructed patient to instill eyedrops as directed advised patient if symptoms worsen and/or unresolved please follow-up with your optometry/ophthalmology for further evaluation.  Advised patient to take Medrol Dosepak as directed with food to completion.  Encouraged patient to increase daily water intake while taking these medications. ? ? ? ? ?ED Prescriptions   ? ? Medication Sig Dispense Auth. Provider  ? azithromycin (AZASITE) 1 % ophthalmic solution 1 gtt in left eye BID x 2 days, then QD x 5 days 2.5 mL Eliezer Lofts, FNP  ? methylPREDNISolone (MEDROL DOSEPAK) 4 MG TBPK tablet Take as directed. 1 each Eliezer Lofts, FNP  ? ?  ? ?PDMP not reviewed this encounter. ?  ?Eliezer Lofts, [place] ?07/13/21 1442 ? ?

## 2021-07-13 NOTE — ED Triage Notes (Signed)
Rash on Rt arm x 1 week ?Needs psorics meds  ?

## 2021-08-22 ENCOUNTER — Telehealth: Payer: Self-pay

## 2021-08-22 NOTE — Telephone Encounter (Signed)
Patient called wanting to know if you can send her in something different from the Flucinolone because it's like putting water on it.

## 2021-10-07 ENCOUNTER — Other Ambulatory Visit: Payer: Self-pay | Admitting: Medical-Surgical

## 2021-10-07 DIAGNOSIS — E785 Hyperlipidemia, unspecified: Secondary | ICD-10-CM

## 2021-10-24 NOTE — Progress Notes (Unsigned)
   Established Patient Office Visit  Subjective   Patient ID: Barbara Lloyd, female   DOB: [birth year] Age: 56 y.o. MRN: [id number]   No chief complaint on file.   HPI Pleasant 56 year old female presenting today for the following:  Diabetes:  Hypertension:  Hyperlipidemia:  Insomnia:   Objective:    There were no vitals filed for this visit.  Physical Exam   No results found for this or any previous visit (from the past 24 hour(s)).   {Labs (Optional):23779}  The 10-year ASCVD risk score (Arnett DK, et al., 2019) is: 11.2%   Values used to calculate the score:     Age: 39 years     Sex: Female     Is Non-Hispanic African American: No     Diabetic: Yes     Tobacco smoker: Yes     Systolic Blood Pressure: 116 mmHg     Is BP treated: Yes     HDL Cholesterol: 41 mg/dL     Total Cholesterol: 161 mg/dL   Assessment & Plan:   No problem-specific Assessment & Plan notes found for this encounter.   No follow-ups on file.  ___________________________________________ Thayer Ohm, DNP, APRN, FNP-BC Primary Care and Sports Medicine [facility] [place]

## 2021-10-25 ENCOUNTER — Ambulatory Visit (INDEPENDENT_AMBULATORY_CARE_PROVIDER_SITE_OTHER): Payer: Self-pay | Admitting: Medical-Surgical

## 2021-10-25 DIAGNOSIS — E119 Type 2 diabetes mellitus without complications: Secondary | ICD-10-CM

## 2021-10-25 DIAGNOSIS — F5101 Primary insomnia: Secondary | ICD-10-CM

## 2021-10-25 DIAGNOSIS — I10 Essential (primary) hypertension: Secondary | ICD-10-CM

## 2021-10-25 DIAGNOSIS — E785 Hyperlipidemia, unspecified: Secondary | ICD-10-CM

## 2021-10-25 DIAGNOSIS — Z91199 Patient's noncompliance with other medical treatment and regimen due to unspecified reason: Secondary | ICD-10-CM

## 2021-12-10 ENCOUNTER — Other Ambulatory Visit: Payer: Self-pay | Admitting: Medical-Surgical

## 2021-12-10 DIAGNOSIS — F5101 Primary insomnia: Secondary | ICD-10-CM

## 2021-12-11 NOTE — Telephone Encounter (Signed)
Patient called back and has been scheduled. Barbara Lloyd

## 2021-12-11 NOTE — Telephone Encounter (Signed)
Lvm for the patient to call and schedule an appointment for further med refills- tvt

## 2021-12-11 NOTE — Telephone Encounter (Signed)
Patient needs an appointment for further refills.  Last office visit 04/27/2021  No Show 10/25/2021  Last filled 06/05/2021  Sent 30 tablets to the pharmacy. NO refills.

## 2021-12-13 ENCOUNTER — Telehealth: Payer: Self-pay | Admitting: Medical-Surgical

## 2021-12-13 DIAGNOSIS — I10 Essential (primary) hypertension: Secondary | ICD-10-CM

## 2021-12-13 DIAGNOSIS — E119 Type 2 diabetes mellitus without complications: Secondary | ICD-10-CM

## 2021-12-13 MED ORDER — LISINOPRIL 10 MG PO TABS: 10.0000 mg | ORAL_TABLET | Freq: Every day | ORAL | 0 refills | Status: DC

## 2021-12-13 MED ORDER — METFORMIN HCL 1000 MG PO TABS: ORAL_TABLET | ORAL | 0 refills | Status: DC

## 2021-12-13 NOTE — Telephone Encounter (Signed)
I sent 90 day refills for Lisinopril and Metformin. The Trazodone was filled 12/11/2021.

## 2021-12-13 NOTE — Telephone Encounter (Signed)
Patient stated she needs medications refilled to tide her over until appointment on 11/9. Trazodone, Lisinopril, and Metformin. Buel Ream

## 2022-01-08 ENCOUNTER — Other Ambulatory Visit: Payer: Self-pay | Admitting: Medical-Surgical

## 2022-01-08 DIAGNOSIS — F5101 Primary insomnia: Secondary | ICD-10-CM

## 2022-01-25 ENCOUNTER — Ambulatory Visit (INDEPENDENT_AMBULATORY_CARE_PROVIDER_SITE_OTHER): Payer: 59 | Admitting: Medical-Surgical

## 2022-01-25 ENCOUNTER — Encounter: Payer: Self-pay | Admitting: Medical-Surgical

## 2022-01-25 VITALS — BP 100/65 | HR 82 | Resp 20 | Ht 70.0 in | Wt 163.4 lb

## 2022-01-25 DIAGNOSIS — E785 Hyperlipidemia, unspecified: Secondary | ICD-10-CM

## 2022-01-25 DIAGNOSIS — I10 Essential (primary) hypertension: Secondary | ICD-10-CM

## 2022-01-25 DIAGNOSIS — F5101 Primary insomnia: Secondary | ICD-10-CM

## 2022-01-25 DIAGNOSIS — L409 Psoriasis, unspecified: Secondary | ICD-10-CM

## 2022-01-25 DIAGNOSIS — E119 Type 2 diabetes mellitus without complications: Secondary | ICD-10-CM

## 2022-01-25 DIAGNOSIS — B001 Herpesviral vesicular dermatitis: Secondary | ICD-10-CM

## 2022-01-25 LAB — POCT GLYCOSYLATED HEMOGLOBIN (HGB A1C): HbA1c, POC (controlled diabetic range): 7.9 % — AB (ref 0.0–7.0)

## 2022-01-25 MED ORDER — PRAVASTATIN SODIUM 20 MG PO TABS: 20.0000 mg | ORAL_TABLET | Freq: Every day | ORAL | 1 refills | Status: DC

## 2022-01-25 MED ORDER — METFORMIN HCL 1000 MG PO TABS: 1000.0000 mg | ORAL_TABLET | Freq: Two times a day (BID) | ORAL | 1 refills | Status: DC

## 2022-01-25 MED ORDER — LISINOPRIL 10 MG PO TABS: 10.0000 mg | ORAL_TABLET | Freq: Every day | ORAL | 0 refills | Status: DC

## 2022-01-25 MED ORDER — TRAZODONE HCL 100 MG PO TABS: 100.0000 mg | ORAL_TABLET | Freq: Every evening | ORAL | 0 refills | Status: DC | PRN

## 2022-01-25 MED ORDER — VALACYCLOVIR HCL 1 G PO TABS: ORAL_TABLET | ORAL | 2 refills | Status: DC

## 2022-01-25 NOTE — Progress Notes (Signed)
Medical screening examination/treatment was performed by qualified nurse practitioner student and as supervising provider I was immediately available for consultation/collaboration. I have reviewed documentation and agree with assessment and plan. ° °Ruthie Berch L. Damein Gaunce, DNP, APRN, FNP-BC °[facility] °Primary Care and Sports Medicine ° °

## 2022-01-25 NOTE — Progress Notes (Signed)
   Established Patient Office Visit  Subjective   Patient ID: Barbara Lloyd, female   DOB: [birth year] Age: 55 y.o. MRN: [id number]   Chief Complaint  Patient presents with   Medication Refill    HPI    Objective:    Vitals:   01/25/22 1019  BP: 100/65  Pulse: 82  Resp: 20  Height: 5\' 10"  (1.778 m)  Weight: 163 lb 6.4 oz (74.1 kg)  SpO2: 96%  BMI (Calculated): 23.45    Physical Exam Vitals and nursing note reviewed.  Constitutional:      General: She is not in acute distress.    Appearance: Normal appearance. She is not ill-appearing.  HENT:     Head: Normocephalic and atraumatic.  Cardiovascular:     Rate and Rhythm: Normal rate and regular rhythm.     Pulses: Normal pulses.     Heart sounds: Normal heart sounds.  Pulmonary:     Effort: Pulmonary effort is normal. No respiratory distress.     Breath sounds: Normal breath sounds. No wheezing, rhonchi or rales.  Skin:    General: Skin is warm and dry.  Neurological:     Mental Status: She is alert and oriented to person, place, and time.  Psychiatric:        Mood and Affect: Mood normal.        Behavior: Behavior normal.        Thought Content: Thought content normal.        Judgment: Judgment normal.      No results found for this or any previous visit (from the past 24 hour(s)).     The 10-year ASCVD risk score (Arnett DK, et al., 2019) is: 8.5%   Values used to calculate the score:     Age: 2 years     Sex: Female     Is Non-Hispanic African American: No     Diabetic: Yes     Tobacco smoker: Yes     Systolic Blood Pressure: 100 mmHg     Is BP treated: Yes     HDL Cholesterol: 41 mg/dL     Total Cholesterol: 161 mg/dL   Assessment & Plan:   No problem-specific Assessment & Plan notes found for this encounter.   No follow-ups on file.  ___________________________________________ 59, DNP, APRN, FNP-BC Primary Care and Sports Medicine [facility]

## 2022-01-25 NOTE — Progress Notes (Signed)
Established Patient Office Visit  Subjective   Patient ID: Barbara Lloyd, female    DOB: [birth year]  Age: 56 y.o. MRN: [id number]  Chief Complaint  Patient presents with   Medication Refill    Medication Refill Associated symptoms include a rash.    Star is her for Chronic Diseases Follow Up  Hypertension:  Medication: Lisinopril 20 mg, once daily.  Compliant: Yes Side effects: Denies Checking BP at home: no, readings n/a Low sodium diet: She reports consuming a general diet but mindful of her portions.  Exercise: She reports walking 3-4 miles a day.  Concerning symptoms: She denies any headache, dizziness, palpitations and or chest pain.   Diabetes:  Type: Type II diabetes mellitus without complication, without long term use of insulin Medications: Metformin 1000 mg, daily.  Compliant: yes Side effects: denies Checking sugars: no , readings n/a Diabetic diet: She stated that she avoids sugars and simple carbohydrates. Eats about every 3 hours, even waking at night a couple of times to eat snacks. Complications: none Eye exam: current Foot exam: current Microalbumin screening: ordered.  Statin: Pravastatin (Pravachol) 20 mg tablet, daily.  ACE/ARB: Lisinopril 10 mg daily Last A1c: 04/27/2021 6.6%   Hyperlipidemia:   Medication: Pravastatin 20 mg, daily.  Side effects: denies Low fat diet: Consumes a general diet, but watches portion.   Primary Insomia: She reports taking trazodone 100 mg as needed and reports no issue in regards to this.   Cold Sore:  She reports a flare of about 3-4x a year, able to manage with valacyclovir 1000 mg, which she takes at the earliest sign of flare up.  Rash: C/O 2 small rash on her right anterior thigh, denies itching.    Review of Systems  Constitutional: Negative.   Respiratory: Negative.    Musculoskeletal: Negative.   Skin:  Positive for rash.  Neurological: Negative.   Psychiatric/Behavioral: Negative.         Objective:     BP 100/65 (BP Location: Right Arm, Cuff Size: Normal)   Pulse 82   Resp 20   Ht 5\' 10"  (1.778 m)   Wt 74.1 kg   SpO2 96%   BMI 23.45 kg/m    Physical Exam Constitutional:      Appearance: Normal appearance. She is normal weight.  HENT:     Head: Normocephalic and atraumatic.     Mouth/Throat:     Mouth: Mucous membranes are moist.  Eyes:     General: No scleral icterus. Cardiovascular:     Rate and Rhythm: Normal rate and regular rhythm.     Pulses: Normal pulses.     Heart sounds: Normal heart sounds.  Pulmonary:     Effort: Pulmonary effort is normal.     Breath sounds: Normal breath sounds.  Skin:    General: Skin is warm and dry.     Capillary Refill: Capillary refill takes less than 2 seconds.     Findings: Rash present.  Neurological:     General: No focal deficit present.     Mental Status: She is alert and oriented to person, place, and time. Mental status is at baseline.  Psychiatric:        Mood and Affect: Mood normal.        Behavior: Behavior normal.        Thought Content: Thought content normal.        Judgment: Judgment normal.      Results for orders placed or performed in visit on  01/25/22  POCT HgB A1C  Result Value Ref Range   Hemoglobin A1C     HbA1c POC (<> result, manual entry)     HbA1c, POC (prediabetic range)     HbA1c, POC (controlled diabetic range) 7.9 (A) 0.0 - 7.0 %      The 10-year ASCVD risk score (Arnett DK, et al., 2019) is: 8.5%    Assessment & Plan:   1. Essential hypertension Her blood pressure readings were at goal today. Continue taking medications as prescribed. Encourage patient to consume a well balanced diet with low sodium intake. Continue with active lifestyle and engaging in exercise at tolerated. We will also check routine blood work today and check her kidney functions and basic chemistries.   - lisinopril (ZESTRIL) 10 MG tablet; Take 1 tablet (10 mg total) by mouth daily.  Dispense: 90  tablet; Refill: 0 - CBC with Differential/Platelet - COMPLETE METABOLIC PANEL WITH GFR - Lipid panel  2. Type 2 diabetes mellitus without complication, without long-term current use of insulin (HCC) With the increased of A1C levels to 7.9, we instructed her to be mindful of her intake even with fruits that contains high amounts of sugar. We also ask her to check her sugar before eating as we increase the metformin 1000 mg to twice daily from here on out. We need to check her urine microalbumin as well.  - POCT HgB A1C - metFORMIN (GLUCOPHAGE) 1000 MG tablet; Take 1 tablet (1,000 mg total) by mouth 2 (two) times daily with a meal. TAKE 1 TABLET BY MOUTH EVERY DAY WITH BREAKFAST  Dispense: 180 tablet; Refill: 1 - Urine Microalbumin w/creat. ratio - COMPLETE METABOLIC PANEL WITH GFR - Lipid panel  3. Hyperlipidemia, unspecified hyperlipidemia type We advised her to consume a well balanced diet and avoiding foods containing saturated and trans fat and opt for healthier fat choices. Its been a year since we check your labs and we will check lipid panels as well.   - pravastatin (PRAVACHOL) 20 MG tablet; Take 1 tablet (20 mg total) by mouth daily.  Dispense: 90 tablet; Refill: 1 - COMPLETE METABOLIC PANEL WITH GFR - Lipid panel  4. Primary insomnia Continue with prescribed sleep aid if you need it. Minimize your screen time specially before going to bed.  - traZODone (DESYREL) 100 MG tablet; Take 1 tablet (100 mg total) by mouth at bedtime as needed for sleep.  Dispense: 30 tablet; Refill: 0  5. Cold sore We sent an updated refill for this medication so it will be ready if you need it.   - valACYclovir (VALTREX) 1000 MG tablet; Take 2 tabs PO Q12hr for one day  Dispense: 4 tablet; Refill: 2  6. Psoriasis The rash on her thigh appears to be a small flare as it has some of the scaly characteristic to it. Continue to apply the medication dermatologist prescribed and we will follow up closely  with their plans.   Return in about 3 months (around 04/27/2022) for DM follow up.   Loretha Stapler, RN Student NP

## 2022-01-26 LAB — CBC WITH DIFFERENTIAL/PLATELET
Absolute Monocytes: 554 cells/uL (ref 200–950)
Basophils Absolute: 62 cells/uL (ref 0–200)
Basophils Relative: 0.8 %
Eosinophils Absolute: 254 cells/uL (ref 15–500)
Eosinophils Relative: 3.3 %
HCT: 40.9 % (ref 35.0–45.0)
Hemoglobin: 13.8 g/dL (ref 11.7–15.5)
Lymphs Abs: 2094 cells/uL (ref 850–3900)
MCH: 30 pg (ref 27.0–33.0)
MCHC: 33.7 g/dL (ref 32.0–36.0)
MCV: 88.9 fL (ref 80.0–100.0)
MPV: 11.5 fL (ref 7.5–12.5)
Monocytes Relative: 7.2 %
Neutro Abs: 4736 cells/uL (ref 1500–7800)
Neutrophils Relative %: 61.5 %
Platelets: 316 10*3/uL (ref 140–400)
RBC: 4.6 10*6/uL (ref 3.80–5.10)
RDW: 12.6 % (ref 11.0–15.0)
Total Lymphocyte: 27.2 %
WBC: 7.7 10*3/uL (ref 3.8–10.8)

## 2022-01-26 LAB — COMPLETE METABOLIC PANEL WITH GFR
AG Ratio: 1.6 (calc) (ref 1.0–2.5)
ALT: 17 U/L (ref 6–29)
AST: 13 U/L (ref 10–35)
Albumin: 4.5 g/dL (ref 3.6–5.1)
Alkaline phosphatase (APISO): 75 U/L (ref 37–153)
BUN: 20 mg/dL (ref 7–25)
CO2: 25 mmol/L (ref 20–32)
Calcium: 9.7 mg/dL (ref 8.6–10.4)
Chloride: 104 mmol/L (ref 98–110)
Creat: 0.55 mg/dL (ref 0.50–1.03)
Globulin: 2.8 g/dL (calc) (ref 1.9–3.7)
Glucose, Bld: 144 mg/dL — ABNORMAL HIGH (ref 65–139)
Potassium: 4.2 mmol/L (ref 3.5–5.3)
Sodium: 139 mmol/L (ref 135–146)
Total Bilirubin: 0.7 mg/dL (ref 0.2–1.2)
Total Protein: 7.3 g/dL (ref 6.1–8.1)
eGFR: 108 mL/min/{1.73_m2} (ref >=60)

## 2022-01-26 LAB — LIPID PANEL
Cholesterol: 185 mg/dL (ref <200)
HDL: 43 mg/dL — ABNORMAL LOW (ref >=50)
LDL Cholesterol (Calc): 112 mg/dL (calc) — ABNORMAL HIGH
Non-HDL Cholesterol (Calc): 142 mg/dL (calc) — ABNORMAL HIGH (ref <130)
Total CHOL/HDL Ratio: 4.3 (calc) (ref <5.0)
Triglycerides: 186 mg/dL — ABNORMAL HIGH (ref <150)

## 2022-01-26 LAB — MICROALBUMIN / CREATININE URINE RATIO
Creatinine, Urine: 146 mg/dL (ref 20–275)
Microalb Creat Ratio: 2 mcg/mg creat (ref <30)
Microalb, Ur: 0.3 mg/dL

## 2022-01-29 MED ORDER — PRAVASTATIN SODIUM 40 MG PO TABS: 40.0000 mg | ORAL_TABLET | Freq: Every day | ORAL | 1 refills | Status: DC

## 2022-01-29 NOTE — Addendum Note (Signed)
Addended byChristen Butter on: 01/29/2022 05:12 PM   Modules accepted: Orders

## 2022-03-03 ENCOUNTER — Other Ambulatory Visit: Payer: Self-pay | Admitting: Medical-Surgical

## 2022-03-03 DIAGNOSIS — F5101 Primary insomnia: Secondary | ICD-10-CM

## 2022-04-26 ENCOUNTER — Ambulatory Visit: Payer: 59 | Admitting: Medical-Surgical

## 2022-04-29 ENCOUNTER — Other Ambulatory Visit: Payer: Self-pay | Admitting: Medical-Surgical

## 2022-04-29 DIAGNOSIS — I10 Essential (primary) hypertension: Secondary | ICD-10-CM

## 2022-05-03 ENCOUNTER — Other Ambulatory Visit: Payer: Self-pay | Admitting: Medical-Surgical

## 2022-05-03 DIAGNOSIS — F5101 Primary insomnia: Secondary | ICD-10-CM

## 2022-05-22 ENCOUNTER — Other Ambulatory Visit: Payer: Self-pay | Admitting: Medical-Surgical

## 2022-05-22 ENCOUNTER — Encounter: Payer: Self-pay | Admitting: Medical-Surgical

## 2022-05-22 ENCOUNTER — Ambulatory Visit (INDEPENDENT_AMBULATORY_CARE_PROVIDER_SITE_OTHER): Payer: 59 | Admitting: Medical-Surgical

## 2022-05-22 VITALS — BP 141/74 | HR 68 | Resp 20 | Ht 70.0 in | Wt 164.2 lb

## 2022-05-22 DIAGNOSIS — F5101 Primary insomnia: Secondary | ICD-10-CM

## 2022-05-22 DIAGNOSIS — E119 Type 2 diabetes mellitus without complications: Secondary | ICD-10-CM

## 2022-05-22 DIAGNOSIS — I499 Cardiac arrhythmia, unspecified: Secondary | ICD-10-CM | POA: Diagnosis not present

## 2022-05-22 DIAGNOSIS — J069 Acute upper respiratory infection, unspecified: Secondary | ICD-10-CM

## 2022-05-22 DIAGNOSIS — I10 Essential (primary) hypertension: Secondary | ICD-10-CM | POA: Diagnosis not present

## 2022-05-22 DIAGNOSIS — E785 Hyperlipidemia, unspecified: Secondary | ICD-10-CM

## 2022-05-22 LAB — POCT GLYCOSYLATED HEMOGLOBIN (HGB A1C): Hemoglobin A1C: 7.5 % — AB (ref 4.0–5.6)

## 2022-05-22 MED ORDER — AZELASTINE-FLUTICASONE 137-50 MCG/ACT NA SUSP: NASAL | 11 refills | Status: DC

## 2022-05-22 MED ORDER — PROMETHAZINE-DM 6.25-15 MG/5ML PO SYRP: 5.0000 mL | ORAL_SOLUTION | Freq: Four times a day (QID) | ORAL | 0 refills | Status: DC | PRN

## 2022-05-22 NOTE — Progress Notes (Signed)
Established Patient Office Visit  Subjective   Patient ID: Barbara Lloyd, female   DOB: 1965/12/05 Age: 57 y.o. MRN: [id number]   Chief Complaint  Patient presents with   Diabetes   Follow-up   HPI Pleasant 56 year old female presenting today for the following:  Diabetes: Has made significant changes to her dietary habits.  Has been able to stop eating at night which has been a lifelong habit.  She has been doing this for the last 2 weeks and notes that her sugars have been responding beautifully.  Was previously having fasting sugars of 170 each morning but more recently the numbers have gone down and over the last few days she has had fasting sugars between 115-122.  Continues metformin 1 g twice daily as prescribed, tolerating well without side effects.  Hypertension: Taking lisinopril 10 mg daily, tolerating well without side effects.  She does have a blood pressure cuff at home but has not been monitoring this.  Following a low-sodium diet. Denies CP, SOB, palpitations, lower extremity edema, dizziness, headaches, or vision changes.  Hyperlipidemia: Taking pravastatin 40 mg daily, tolerating well without side effects.  Working on a low-fat heart healthy diet.  Insomnia: Taking trazodone 100 mg nightly as needed.  Reports the medication seems to work well for her.  Today she reports that she has been sick for the last 2 weeks.  She has been experiencing cough, significant fatigue, headache, diarrhea, runny nose, and sneezing.  Has tried over-the-counter ibuprofen, Mucinex, and daytime cold and flu formulation.  Notes that her cough is much worse at night and she has not been able to sleep well because of it.  Of note, she is on Tremfya that she started in January.  Unfortunately, she has missed her last dose due to her illness symptoms.   Objective:    Vitals:   05/22/22 1446 05/22/22 1533  BP: (!) 141/80 (!) 141/74  Pulse: 67 68  Resp: 20 20  Height: '5\' 10"'$  (1.778 m)    Weight: 164 lb 3.2 oz (74.5 kg)   SpO2: 98% 97%  BMI (Calculated): 23.56     Physical Exam Vitals reviewed.  Constitutional:      General: She is not in acute distress.    Appearance: Normal appearance. She is not ill-appearing.  HENT:     Head: Normocephalic and atraumatic.     Right Ear: Tympanic membrane, ear canal and external ear normal. There is no impacted cerumen.     Left Ear: Tympanic membrane, ear canal and external ear normal. There is no impacted cerumen.     Mouth/Throat:     Mouth: Mucous membranes are moist.  Eyes:     General: No scleral icterus.       Right eye: No discharge.        Left eye: No discharge.     Extraocular Movements: Extraocular movements intact.     Conjunctiva/sclera: Conjunctivae normal.     Pupils: Pupils are equal, round, and reactive to light.  Cardiovascular:     Rate and Rhythm: Normal rate and regular rhythm.     Pulses: Normal pulses.     Heart sounds: Normal heart sounds.  Pulmonary:     Effort: Pulmonary effort is normal. No respiratory distress.     Breath sounds: Normal breath sounds. No wheezing, rhonchi or rales.  Musculoskeletal:     Cervical back: Neck supple.  Lymphadenopathy:     Cervical: No cervical adenopathy.  Skin:    General: Skin  is warm and dry.  Neurological:     Mental Status: She is alert and oriented to person, place, and time.     Motor: Weakness: .diagm.  Psychiatric:        Mood and Affect: Mood normal.        Behavior: Behavior normal.        Thought Content: Thought content normal.        Judgment: Judgment normal.    Results for orders placed or performed in visit on 05/22/22 (from the past 24 hour(s))  POCT HgB A1C     Status: Abnormal   Collection Time: 05/22/22  3:18 PM  Result Value Ref Range   Hemoglobin A1C 7.5 (A) 4.0 - 5.6 %   HbA1c POC (<> result, manual entry)     HbA1c, POC (prediabetic range)     HbA1c, POC (controlled diabetic range)         The 10-year ASCVD risk score  (Arnett DK, et al., 2019) is: 17.7%   Values used to calculate the score:     Age: 68 years     Sex: Female     Is Non-Hispanic African American: No     Diabetic: Yes     Tobacco smoker: Yes     Systolic Blood Pressure: [id number] mmHg     Is BP treated: Yes     HDL Cholesterol: 43 mg/dL     Total Cholesterol: 185 mg/dL   Assessment & Plan:   1. Type 2 diabetes mellitus without complication, without long-term current use of insulin (HCC) POCT hemoglobin A1c was previously 7.9% and has come down to 7.5%.  With the recent changes over the last couple weeks feel that sustaining these changes will likely get her to controlled status.  Recommend continuing to avoid eating at night.  Continue metformin 1 g twice daily.  Monitor blood sugars with a goal of 120 or less fasting. - POCT HgB A1C  2. Essential hypertension Blood pressure is a bit elevated on arrival and again on recheck.  She has been taking over-the-counter cough and cold medication so this is likely causing a bit of elevation.  Recommend looking for options made specifically for patients with high blood pressure such as Coricidin.  Continue lisinopril 10 mg daily.  3. Hyperlipidemia, unspecified hyperlipidemia type Continue pravastatin 40 mg daily.  4. Primary insomnia Continue trazodone 100 mg nightly as needed.  5. Irregular heartbeat In office EKG completed showing rate of 64, normal sinus rhythm, normal axis, no acute changes.  Advised to monitor for concerning symptoms such as lightheadedness/dizziness, chest pain, shortness of breath, and palpitations.  Suspect the irregularities may have been PVCs/PACs but none captured on EKG. - EKG 12-Lead  6. Viral URI with cough Suspect her symptoms started out as a viral upper respiratory infection and she is now on the tail end of this.  No indication for an antibiotic at this time but would recommend the addition of a daily antihistamine such as Zyrtec.  Adding Dymista nasal spray twice  daily.  Adding Promethazine DM to help with nighttime cough and rest.   Return in about 3 months (around 08/22/2022) for DM follow up.  ___________________________________________ Clearnce Sorrel, DNP, APRN, FNP-BC [facility] [place]

## 2022-05-28 ENCOUNTER — Telehealth: Payer: Self-pay

## 2022-05-28 MED ORDER — AZITHROMYCIN 250 MG PO TABS: ORAL_TABLET | ORAL | 0 refills | Status: AC

## 2022-05-28 NOTE — Telephone Encounter (Signed)
Patient called requesting antibiotics because she seen you last week and she still isn't any better and you told her that if she wasn't feeling better to let you know so you can call in something for her.  [facility] in [place]

## 2022-05-28 NOTE — Telephone Encounter (Signed)
Patient advised.

## 2022-05-28 NOTE — Telephone Encounter (Signed)
Azithromycin sent to the pharmacy on file. ___________________________________________ Clearnce Sorrel, DNP, APRN, FNP-BC [facility] [place]

## 2022-07-24 ENCOUNTER — Other Ambulatory Visit: Payer: Self-pay | Admitting: Medical-Surgical

## 2022-07-24 DIAGNOSIS — E785 Hyperlipidemia, unspecified: Secondary | ICD-10-CM

## 2022-08-05 ENCOUNTER — Other Ambulatory Visit: Payer: Self-pay | Admitting: Medical-Surgical

## 2022-08-05 DIAGNOSIS — I10 Essential (primary) hypertension: Secondary | ICD-10-CM

## 2022-08-19 ENCOUNTER — Other Ambulatory Visit: Payer: Self-pay | Admitting: Medical-Surgical

## 2022-08-19 DIAGNOSIS — E119 Type 2 diabetes mellitus without complications: Secondary | ICD-10-CM

## 2022-08-22 ENCOUNTER — Encounter: Payer: Self-pay | Admitting: Medical-Surgical

## 2022-10-04 LAB — HM DIABETES EYE EXAM

## 2022-10-18 ENCOUNTER — Other Ambulatory Visit: Payer: Self-pay | Admitting: Medical-Surgical

## 2022-10-18 ENCOUNTER — Encounter: Payer: Self-pay | Admitting: Medical-Surgical

## 2022-10-18 DIAGNOSIS — I10 Essential (primary) hypertension: Secondary | ICD-10-CM

## 2022-10-19 ENCOUNTER — Encounter: Payer: Self-pay | Admitting: Medical-Surgical

## 2022-10-22 ENCOUNTER — Telehealth: Payer: Self-pay

## 2022-10-22 ENCOUNTER — Ambulatory Visit
Admission: RE | Admit: 2022-10-22 | Discharge: 2022-10-22 | Disposition: A | Discharge status: Home / Self Care | Payer: 59 | Source: Ambulatory Visit | Attending: Family Medicine | Admitting: Family Medicine

## 2022-10-22 ENCOUNTER — Other Ambulatory Visit: Payer: Self-pay

## 2022-10-22 VITALS — BP 147/89 | HR 86 | Temp 97.8°F | Resp 16

## 2022-10-22 DIAGNOSIS — K5900 Constipation, unspecified: Secondary | ICD-10-CM

## 2022-10-22 MED ORDER — MAGNESIUM CITRATE PO SOLN
ORAL | 0 refills | Status: DC
Start: 2022-10-22 — End: 2022-10-22

## 2022-10-22 MED ORDER — MAGNESIUM CITRATE PO SOLN: ORAL | 0 refills | Status: DC

## 2022-10-22 NOTE — ED Triage Notes (Signed)
C/o constipation, reports had small bowel movement this morning

## 2022-10-22 NOTE — Discharge Instructions (Addendum)
Advised patient to take medication as directed.  Encouraged increase daily water intake to 64 ounces per day 7 days/week.  Advised patient to increase daily fiber intake to 60-80 grams.  Advised patient if symptoms worsen and/or unresolved please follow-up with PCP, GI, or here for further evaluation.

## 2022-10-22 NOTE — ED Provider Notes (Signed)
[facility]    CSN: [id number] Arrival date & time: 10/22/22  1501      History   Chief Complaint Chief Complaint  Patient presents with   Constipation    Entered by patient    HPI Barbara Lloyd is a 57 y.o. female.   HPI 57 year old female presents with constipation reports having small bowel movement earlier today.  Patient reports long history of constipation and possibly do to the fact that she does not drink much water on a daily basis.  Patient reports not having first colonoscopy at age 6. Patient reports going 4 days or more without having a bowel movement on regular basis.  PMH significant for T2DM, HLD, and HTN  Past Medical History:  Diagnosis Date   COVID-19 03/30/2020   Diabetes (HCC)    High cholesterol    Hypertension     Patient Active Problem List   Diagnosis Date Noted   Psoriasis 01/25/2021   Cold sore 01/25/2021   Primary insomnia 01/25/2021   Hidradenitis suppurativa 11/09/2019   History of tubal ligation 03/31/2019   History of endometrial ablation 03/31/2019   Adopted 03/31/2019   Influenza vaccination declined 03/31/2019   Hyperlipidemia 03/28/2016   Type 2 diabetes mellitus without complication, without long-term current use of insulin (HCC) 03/27/2016   Essential hypertension 08/16/2015   Dental caries 08/16/2015    Past Surgical History:  Procedure Laterality Date   CESAREAN SECTION     x2   ENDOMETRIAL ABLATION     2006   FOOT SURGERY     GANGLION CYST EXCISION      OB History   No obstetric history on file.      Home Medications    Prior to Admission medications   Medication Sig Start Date End Date Taking? Authorizing Provider  Azelastine-Fluticasone 137-50 MCG/ACT SUSP One spray each nostril BID 05/22/22   [name], NP  Fluocinolone Acetonide Body 0.01 % OIL APPLY 1 APPLICATION TOPICALLY TWICE A DAY AS NEEDED 01/25/21   [name], NP  Guselkumab (TREMFYA) 100 MG/ML SOPN Inject into the skin.     [provider]  lisinopril (ZESTRIL) 10 MG tablet Take 1 tablet (10 mg total) by mouth daily. NEEDS APPOINTMENT FOR FURTHER REFILLS. 10/18/22   [name], NP  magnesium citrate SOLN Take 150 mL PO twice daily for 3 days 10/22/22   Trevor Iha, FNP  metFORMIN (GLUCOPHAGE) 1000 MG tablet TAKE 1 TABLET BY MOUTH 2 TIMES DAILY WITH A MEAL. 08/20/22   [name], NP  pravastatin (PRAVACHOL) 40 MG tablet TAKE 1 TABLET BY MOUTH EVERY DAY 07/24/22   [name], NP  promethazine-dextromethorphan (PROMETHAZINE-DM) 6.25-15 MG/5ML syrup Take 5 mLs by mouth 4 (four) times daily as needed for cough. 05/22/22   [name], NP  traZODone (DESYREL) 100 MG tablet TAKE 1 TABLET BY MOUTH AT BEDTIME AS NEEDED FOR SLEEP 05/22/22   [name], NP  valACYclovir (VALTREX) 1000 MG tablet Take 2 tabs PO Q12hr for one day 01/25/22   [name], NP    Family History Family History  Adopted: Yes    Social History Social History   Tobacco Use   Smoking status: Every Day    Current packs/day: 0.00    Average packs/day: 0.5 packs/day for 25.0 years (12.5 ttl pk-yrs)    Types: Cigarettes    Start date: 04/07/1995    Last attempt to quit: 04/06/2020    Years since quitting: 2.5   Smokeless tobacco: Never  Vaping  Use   Vaping status: Never Used  Substance Use Topics   Alcohol use: Yes    Alcohol/week: 3.0 standard drinks of alcohol    Types: 3 Standard drinks or equivalent per week    Comment: occ   Drug use: No     Allergies   Ciprofloxacin and Sulfa antibiotics   Review of Systems Review of Systems  Gastrointestinal:  Positive for constipation.  All other systems reviewed and are negative.    Physical Exam Triage Vital Signs ED Triage Vitals  Encounter Vitals Group     BP 10/22/22 1503 (!) 147/89     Systolic BP Percentile --      Diastolic BP Percentile --      Pulse Rate 10/22/22 1503 86     Resp 10/22/22 1503 16     Temp 10/22/22 1503 97.8 F (36.6 C)     Temp Source 10/22/22 1503  Oral     SpO2 10/22/22 1503 100 %     Weight --      Height --      Head Circumference --      Peak Flow --      Pain Score 10/22/22 1504 2     Pain Loc --      Pain Education --      Exclude from Growth Chart --    No data found.  Updated Vital Signs BP (!) 147/89 (BP Location: Left Arm)   Pulse 86   Temp 97.8 F (36.6 C) (Oral)   Resp 16   SpO2 100%    Physical Exam Vitals and nursing note reviewed.  Constitutional:      General: She is not in acute distress.    Appearance: Normal appearance. She is normal weight. She is not ill-appearing, toxic-appearing or diaphoretic.  HENT:     Head: Normocephalic and atraumatic.     Mouth/Throat:     Mouth: Mucous membranes are moist.     Pharynx: Oropharynx is clear.  Eyes:     Extraocular Movements: Extraocular movements intact.     Conjunctiva/sclera: Conjunctivae normal.     Pupils: Pupils are equal, round, and reactive to light.  Cardiovascular:     Rate and Rhythm: Normal rate and regular rhythm.     Pulses: Normal pulses.     Heart sounds: Normal heart sounds.  Pulmonary:     Effort: Pulmonary effort is normal.     Breath sounds: Normal breath sounds. No wheezing, rhonchi or rales.  Abdominal:     Tenderness: There is no right CVA tenderness or left CVA tenderness.  Musculoskeletal:        General: Normal range of motion.     Cervical back: Normal range of motion and neck supple.  Skin:    General: Skin is warm and dry.  Neurological:     General: No focal deficit present.     Mental Status: She is alert and oriented to person, place, and time. Mental status is at baseline.  Psychiatric:        Mood and Affect: Mood normal.        Behavior: Behavior normal.        Thought Content: Thought content normal.      UC Treatments / Results  Labs (all labs ordered are listed, but only abnormal results are displayed) Labs Reviewed - No data to display  EKG   Radiology No results  found.  Procedures Procedures (including critical care time)  Medications Ordered in  UC Medications - No data to display  Initial Impression / Assessment and Plan / UC Course  I have reviewed the triage vital signs and the nursing notes.  Pertinent labs & imaging results that were available during my care of the patient were reviewed by me and considered in my medical decision making (see chart for details).     MDM: 1.  Constipation, unspecified constipation type-Rx'd mag citrate 150 mL twice daily x 3 days. Advised patient to take medication as directed.  Encouraged increase daily water intake to 64 ounces per day 7 days/week.  Advised patient to increase daily fiber intake to 60-80 grams.  Advised patient if symptoms worsen and/or unresolved please follow-up with PCP, GI, or here for further evaluation.  Patient discharged home, hemodynamically stable. Final Clinical Impressions(s) / UC Diagnoses   Final diagnoses:  Constipation, unspecified constipation type     Discharge Instructions      Advised patient to take medication as directed.  Encouraged increase daily water intake to 64 ounces per day 7 days/week.  Advised patient to increase daily fiber intake to 60-80 grams.  Advised patient if symptoms worsen and/or unresolved please follow-up with PCP, GI, or here for further evaluation.     ED Prescriptions     Medication Sig Dispense Auth. Provider   magnesium citrate SOLN Take 150 mL PO twice daily for 3 days 900 mL Trevor Iha, FNP      PDMP not reviewed this encounter.   Trevor Iha, FNP 10/22/22 [phone]

## 2022-10-22 NOTE — Telephone Encounter (Signed)
Patient called to have rx resent

## 2022-10-26 ENCOUNTER — Encounter: Payer: Self-pay | Admitting: Medical-Surgical

## 2022-11-09 ENCOUNTER — Ambulatory Visit (INDEPENDENT_AMBULATORY_CARE_PROVIDER_SITE_OTHER): Payer: 59 | Admitting: Family Medicine

## 2022-11-09 ENCOUNTER — Encounter: Payer: Self-pay | Admitting: Family Medicine

## 2022-11-09 VITALS — BP 146/78 | HR 78 | Resp 20 | Ht 70.0 in | Wt 169.8 lb

## 2022-11-09 DIAGNOSIS — K5909 Other constipation: Secondary | ICD-10-CM

## 2022-11-09 DIAGNOSIS — Z1211 Encounter for screening for malignant neoplasm of colon: Secondary | ICD-10-CM

## 2022-11-09 DIAGNOSIS — Z1231 Encounter for screening mammogram for malignant neoplasm of breast: Secondary | ICD-10-CM | POA: Diagnosis not present

## 2022-11-09 MED ORDER — LINACLOTIDE 72 MCG PO CAPS: 72.0000 ug | ORAL_CAPSULE | Freq: Every day | ORAL | 3 refills | Status: DC

## 2022-11-09 MED ORDER — GLYCERIN (ADULT) 2 G RE SUPP: 1.0000 | RECTAL | 0 refills | Status: DC | PRN

## 2022-11-09 NOTE — Assessment & Plan Note (Signed)
57 year old female presents with concerns of constipation.  She has tried increasing water intake as well as using Metamucil daily.  She still feels like her stools are hard and she is having to strain.  We had a long discussion about constipation and I do believe she has chronic constipation.  Will go ahead and try Linzess.  I have also given patient glycerin suppositories to use if she has not had a bowel movement in 3 days.  On physical exam patient had good bowel sounds was nontender to palpation of her abdominal quadrants.  No concern for obstruction

## 2022-11-09 NOTE — Progress Notes (Signed)
Acute Office Visit  Subjective:     Patient ID: Barbara Lloyd, female    DOB: 12/27/1965, 57 y.o.   MRN: [id number]  Chief Complaint  Patient presents with   Constipation    X2wks bloated, gassy. Taking metamucil daily still like she has to strain    HPI Patient is in today for concerns for constipation. She was seen in urgent care at the beginning of the month for the issue as well and given Mag citrate BID for 3 days. She was also encouraged to drink more water throughout the day. She has a long hx of constipation. She is using metamucil daily and has been feeling like she is straining. Last bowel movement was this am.  Review of Systems  Constitutional:  Negative for chills and fever.  Respiratory:  Negative for cough and shortness of breath.   Cardiovascular:  Negative for chest pain.  Gastrointestinal:  Positive for constipation.  Neurological:  Negative for headaches.        Objective:    BP (!) 146/78 (BP Location: Left Arm, Patient Position: Sitting, Cuff Size: Normal)   Pulse 78   Resp 20   Ht 5\' 10"  (1.778 m)   Wt 169 lb 12 oz (77 kg)   SpO2 100%   BMI 24.36 kg/m    Physical Exam Vitals and nursing note reviewed.  Constitutional:      General: She is not in acute distress.    Appearance: Normal appearance.  HENT:     Head: Normocephalic and atraumatic.     Right Ear: External ear normal.     Left Ear: External ear normal.     Nose: Nose normal.  Eyes:     Conjunctiva/sclera: Conjunctivae normal.  Cardiovascular:     Rate and Rhythm: Normal rate and regular rhythm.  Pulmonary:     Effort: Pulmonary effort is normal.     Breath sounds: Normal breath sounds.  Abdominal:     General: Bowel sounds are normal. There is no distension.     Tenderness: There is no abdominal tenderness.  Neurological:     General: No focal deficit present.     Mental Status: She is alert and oriented to person, place, and time.  Psychiatric:        Mood and  Affect: Mood normal.        Behavior: Behavior normal.        Thought Content: Thought content normal.        Judgment: Judgment normal.     No results found for any visits on 11/09/22.      Assessment & Plan:   Problem List Items Addressed This Visit       Digestive   Chronic constipation - Primary    57 year old female presents with concerns of constipation.  She has tried increasing water intake as well as using Metamucil daily.  She still feels like her stools are hard and she is having to strain.  We had a long discussion about constipation and I do believe she has chronic constipation.  Will go ahead and try Linzess.  I have also given patient glycerin suppositories to use if she has not had a bowel movement in 3 days.  On physical exam patient had good bowel sounds was nontender to palpation of her abdominal quadrants.  No concern for obstruction      Relevant Medications   glycerin adult 2 g suppository   linaclotide (LINZESS) 72 MCG capsule  Other Visit Diagnoses     Encounter for screening mammogram for malignant neoplasm of breast       Relevant Orders   MM DIGITAL SCREENING BILATERAL   Screening for colon cancer       Relevant Orders   Cologuard       Meds ordered this encounter  Medications   glycerin adult 2 g suppository    Sig: Place 1 suppository rectally as needed for constipation.    Dispense:  12 suppository    Refill:  0   linaclotide (LINZESS) 72 MCG capsule    Sig: Take 1 capsule (72 mcg total) by mouth daily before breakfast.    Dispense:  30 capsule    Refill:  3    Return in about 4 weeks (around 12/07/2022) for with PCP.  Charlton Amor, DO

## 2022-11-12 ENCOUNTER — Other Ambulatory Visit: Payer: Self-pay | Admitting: Medical-Surgical

## 2022-11-12 DIAGNOSIS — I10 Essential (primary) hypertension: Secondary | ICD-10-CM

## 2022-11-16 ENCOUNTER — Other Ambulatory Visit: Payer: Self-pay | Admitting: Medical-Surgical

## 2022-11-16 DIAGNOSIS — F5101 Primary insomnia: Secondary | ICD-10-CM

## 2022-11-27 ENCOUNTER — Ambulatory Visit
Admission: RE | Admit: 2022-11-27 | Discharge: 2022-11-27 | Disposition: A | Discharge status: Home / Self Care | Payer: 59 | Source: Ambulatory Visit | Attending: Family Medicine | Admitting: Family Medicine

## 2022-11-27 ENCOUNTER — Other Ambulatory Visit: Payer: Self-pay

## 2022-11-27 VITALS — BP 128/77 | HR 81 | Temp 98.1°F | Resp 18

## 2022-11-27 DIAGNOSIS — T7840XA Allergy, unspecified, initial encounter: Secondary | ICD-10-CM

## 2022-11-27 DIAGNOSIS — S80862A Insect bite (nonvenomous), left lower leg, initial encounter: Secondary | ICD-10-CM | POA: Diagnosis not present

## 2022-11-27 DIAGNOSIS — W57XXXA Bitten or stung by nonvenomous insect and other nonvenomous arthropods, initial encounter: Secondary | ICD-10-CM

## 2022-11-27 MED ORDER — FLUOCINONIDE EMULSIFIED BASE 0.05 % EX CREA: 1.0000 | TOPICAL_CREAM | Freq: Two times a day (BID) | CUTANEOUS | 0 refills | Status: DC

## 2022-11-27 NOTE — ED Provider Notes (Signed)
[facility]    CSN: [id number] Arrival date & time: 11/27/22  1508      History   Chief Complaint Chief Complaint  Patient presents with   Insect Bite    HPI Barbara Lloyd is a 57 y.o. female.   Patient has insect bites on the outside of her left leg, near the calf that do not appear to be healing.  They have been there most a week.  There is a hard knot underneath the bite.  She is concerned it could be a spider bite.  She first noticed them while she was at work, she thought something crawled inside her pant leg.  She states it itches moderately unless she scratches it and then it itches terribly.  No known allergies    Past Medical History:  Diagnosis Date   COVID-19 03/30/2020   Diabetes (HCC)    High cholesterol    Hypertension     Patient Active Problem List   Diagnosis Date Noted   Chronic constipation 11/09/2022   Psoriasis 01/25/2021   Cold sore 01/25/2021   Primary insomnia 01/25/2021   Hidradenitis suppurativa 11/09/2019   History of tubal ligation 03/31/2019   History of endometrial ablation 03/31/2019   Adopted 03/31/2019   Influenza vaccination declined 03/31/2019   Hyperlipidemia 03/28/2016   Type 2 diabetes mellitus without complication, without long-term current use of insulin (HCC) 03/27/2016   Essential hypertension 08/16/2015   Dental caries 08/16/2015    Past Surgical History:  Procedure Laterality Date   CESAREAN SECTION     x2   ENDOMETRIAL ABLATION     2006   FOOT SURGERY     GANGLION CYST EXCISION      OB History   No obstetric history on file.      Home Medications    Prior to Admission medications   Medication Sig Start Date End Date Taking? Authorizing Provider  fluocinonide-emollient (LIDEX-E) 0.05 % cream Apply 1 Application topically 2 (two) times daily. 11/27/22  Yes Eustace Moore, MD  linaclotide [facility]) 72 MCG capsule Take 1 capsule (72 mcg total) by mouth daily before breakfast. 11/09/22    Charlton Amor, DO  lisinopril (ZESTRIL) 10 MG tablet TAKE 1 TABLET (10 MG TOTAL) BY MOUTH DAILY. NEEDS APPOINTMENT FOR FURTHER REFILLS. 11/12/22   [name], NP  metFORMIN (GLUCOPHAGE) 1000 MG tablet TAKE 1 TABLET BY MOUTH 2 TIMES DAILY WITH A MEAL. 08/20/22   [name], NP  pravastatin (PRAVACHOL) 40 MG tablet TAKE 1 TABLET BY MOUTH EVERY DAY 07/24/22   [name], NP  traZODone (DESYREL) 100 MG tablet TAKE 1 TABLET BY MOUTH EVERY DAY AT BEDTIME AS NEEDED FOR SLEEP 11/16/22   [name], NP  valACYclovir (VALTREX) 1000 MG tablet Take 2 tabs PO Q12hr for one day 01/25/22   [name], NP    Family History Family History  Adopted: Yes    Social History Social History   Tobacco Use   Smoking status: Every Day    Current packs/day: 0.00    Average packs/day: 0.5 packs/day for 25.0 years (12.5 ttl pk-yrs)    Types: Cigarettes    Start date: 04/07/1995    Last attempt to quit: 04/06/2020    Years since quitting: 2.6   Smokeless tobacco: Never  Vaping Use   Vaping status: Never Used  Substance Use Topics   Alcohol use: Yes    Alcohol/week: 3.0 standard drinks of alcohol    Types: 3 Standard drinks or equivalent  per week    Comment: occ   Drug use: No     Allergies   Ciprofloxacin and Sulfa antibiotics   Review of Systems Review of Systems See HPI  Physical Exam Triage Vital Signs ED Triage Vitals  Encounter Vitals Group     BP 11/27/22 1518 128/77     Systolic BP Percentile --      Diastolic BP Percentile --      Pulse Rate 11/27/22 1518 81     Resp 11/27/22 1518 18     Temp 11/27/22 1518 98.1 F (36.7 C)     Temp Source 11/27/22 1518 Oral     SpO2 11/27/22 1518 98 %     Weight --      Height --      Head Circumference --      Peak Flow --      Pain Score 11/27/22 1519 3     Pain Loc --      Pain Education --      Exclude from Growth Chart --    No data found.  Updated Vital Signs BP 128/77   Pulse 81   Temp 98.1 F (36.7 C) (Oral)   Resp 18   SpO2  98%   Physical Exam Constitutional:      General: She is not in acute distress.    Appearance: She is well-developed and normal weight.  HENT:     Head: Normocephalic and atraumatic.  Eyes:     Conjunctiva/sclera: Conjunctivae normal.     Pupils: Pupils are equal, round, and reactive to light.  Cardiovascular:     Rate and Rhythm: Normal rate.  Pulmonary:     Effort: Pulmonary effort is normal. No respiratory distress.  Abdominal:     General: There is no distension.     Palpations: Abdomen is soft.  Musculoskeletal:        General: Normal range of motion.     Cervical back: Normal range of motion.  Skin:    General: Skin is warm and dry.     Findings: Rash present.     Comments: On the lower leg there is a cluster of papules, firm, deeply erythematous, 3 to 5 mm across most with excoriated center  Neurological:     Mental Status: She is alert.      UC Treatments / Results  Labs (all labs ordered are listed, but only abnormal results are displayed) Labs Reviewed - No data to display  EKG   Radiology No results found.  Procedures Procedures (including critical care time)  Medications Ordered in UC Medications - No data to display  Initial Impression / Assessment and Plan / UC Course  I have reviewed the triage vital signs and the nursing notes.  Pertinent labs & imaging results that were available during my care of the patient were reviewed by me and considered in my medical decision making (see chart for details).     I explained to the patient that I cannot tell her what kind of insect may have bitten her.  I reassured her that it was not anything serious.  This should clear with time.  Cortisone cREme is given Final Clinical Impressions(s) / UC Diagnoses   Final diagnoses:  Allergic reaction, initial encounter  Insect bite of left lower leg, initial encounter     Discharge Instructions      May use antihistamines for itching.  Claritin or Zyrtec  during the day and Benadryl at night  Apply the cortisone cream twice a day Call or return for problems   ED Prescriptions     Medication Sig Dispense Auth. Provider   fluocinonide-emollient (LIDEX-E) 0.05 % cream Apply 1 Application topically 2 (two) times daily. 15 g Eustace Moore, MD      PDMP not reviewed this encounter.   Eustace Moore, MD 11/27/22 [phone]

## 2022-11-27 NOTE — Discharge Instructions (Signed)
May use antihistamines for itching.  Claritin or Zyrtec during the day and Benadryl at night Apply the cortisone cream twice a day Call or return for problems

## 2022-11-27 NOTE — ED Triage Notes (Signed)
Insect bites to [facility] since last thursday

## 2022-12-02 ENCOUNTER — Ambulatory Visit
Admission: RE | Admit: 2022-12-02 | Discharge: 2022-12-02 | Disposition: A | Discharge status: Home / Self Care | Payer: 59 | Source: Ambulatory Visit | Attending: Family Medicine | Admitting: Family Medicine

## 2022-12-02 ENCOUNTER — Other Ambulatory Visit: Payer: Self-pay

## 2022-12-02 VITALS — BP 121/75 | HR 70 | Temp 98.5°F | Resp 16

## 2022-12-02 DIAGNOSIS — L237 Allergic contact dermatitis due to plants, except food: Secondary | ICD-10-CM | POA: Diagnosis not present

## 2022-12-02 MED ORDER — PREDNISONE 50 MG PO TABS: ORAL_TABLET | ORAL | 0 refills | Status: DC

## 2022-12-02 NOTE — Discharge Instructions (Signed)
Continue Benadryl at night May use any cream of benefit Take prednisone once a day for 5 days This may take 2 weeks to resolve completely

## 2022-12-02 NOTE — ED Provider Notes (Signed)
[facility]    CSN: [id number] Arrival date & time: 12/02/22  0900      History   Chief Complaint Chief Complaint  Patient presents with   Insect Bite    HPI Barbara Lloyd is a 57 y.o. female.   Patient was here few days ago for insect bites and rash.  It looks mostly like contact dermatitis.  She is here because the rash has spread.  She now has itchy bumps on her upper thighs as well as the welts on her left lower leg.  She is taking Benadryl at night.  She is using cortisone cream and a Benadryl cream.  Has a lot of itching    Past Medical History:  Diagnosis Date   COVID-19 03/30/2020   Diabetes (HCC)    High cholesterol    Hypertension     Patient Active Problem List   Diagnosis Date Noted   Chronic constipation 11/09/2022   Psoriasis 01/25/2021   Cold sore 01/25/2021   Primary insomnia 01/25/2021   Hidradenitis suppurativa 11/09/2019   History of tubal ligation 03/31/2019   History of endometrial ablation 03/31/2019   Adopted 03/31/2019   Influenza vaccination declined 03/31/2019   Hyperlipidemia 03/28/2016   Type 2 diabetes mellitus without complication, without long-term current use of insulin (HCC) 03/27/2016   Essential hypertension 08/16/2015   Dental caries 08/16/2015    Past Surgical History:  Procedure Laterality Date   CESAREAN SECTION     x2   ENDOMETRIAL ABLATION     2006   FOOT SURGERY     GANGLION CYST EXCISION      OB History   No obstetric history on file.      Home Medications    Prior to Admission medications   Medication Sig Start Date End Date Taking? Authorizing Provider  predniSONE (DELTASONE) 50 MG tablet Take once a day for 5 days.  Take with food 12/02/22  Yes Eustace Moore, MD  fluocinonide-emollient (LIDEX-E) 0.05 % cream Apply 1 Application topically 2 (two) times daily. 11/27/22   Eustace Moore, MD  linaclotide [facility]) 72 MCG capsule Take 1 capsule (72 mcg total) by mouth daily before  breakfast. 11/09/22   Charlton Amor, DO  lisinopril (ZESTRIL) 10 MG tablet TAKE 1 TABLET (10 MG TOTAL) BY MOUTH DAILY. NEEDS APPOINTMENT FOR FURTHER REFILLS. 11/12/22   [name], NP  metFORMIN (GLUCOPHAGE) 1000 MG tablet TAKE 1 TABLET BY MOUTH 2 TIMES DAILY WITH A MEAL. 08/20/22   [name], NP  pravastatin (PRAVACHOL) 40 MG tablet TAKE 1 TABLET BY MOUTH EVERY DAY 07/24/22   [name], NP  traZODone (DESYREL) 100 MG tablet TAKE 1 TABLET BY MOUTH EVERY DAY AT BEDTIME AS NEEDED FOR SLEEP 11/16/22   [name], NP  valACYclovir (VALTREX) 1000 MG tablet Take 2 tabs PO Q12hr for one day 01/25/22   [name], NP    Family History Family History  Adopted: Yes    Social History Social History   Tobacco Use   Smoking status: Every Day    Current packs/day: 0.00    Average packs/day: 0.5 packs/day for 25.0 years (12.5 ttl pk-yrs)    Types: Cigarettes    Start date: 04/07/1995    Last attempt to quit: 04/06/2020    Years since quitting: 2.6   Smokeless tobacco: Never  Vaping Use   Vaping status: Never Used  Substance Use Topics   Alcohol use: Yes    Alcohol/week: 3.0 standard drinks of alcohol  Types: 3 Standard drinks or equivalent per week    Comment: occ   Drug use: No     Allergies   Ciprofloxacin and Sulfa antibiotics   Review of Systems Review of Systems See HPI  Physical Exam Triage Vital Signs ED Triage Vitals [12/02/22 0910]  Encounter Vitals Group     BP 121/75     Systolic BP Percentile      Diastolic BP Percentile      Pulse Rate 70     Resp 16     Temp 98.5 F (36.9 C)     Temp Source Oral     SpO2 99 %     Weight      Height      Head Circumference      Peak Flow      Pain Score 0     Pain Loc      Pain Education      Exclude from Growth Chart    No data found.  Updated Vital Signs BP 121/75 (BP Location: Left Arm)   Pulse 70   Temp 98.5 F (36.9 C) (Oral)   Resp 16   SpO2 99%       Physical Exam Constitutional:      General: She  is not in acute distress.    Appearance: She is well-developed.  HENT:     Head: Normocephalic and atraumatic.  Eyes:     Conjunctiva/sclera: Conjunctivae normal.     Pupils: Pupils are equal, round, and reactive to light.  Cardiovascular:     Rate and Rhythm: Normal rate.  Pulmonary:     Effort: Pulmonary effort is normal. No respiratory distress.  Abdominal:     General: There is no distension.     Palpations: Abdomen is soft.  Musculoskeletal:        General: Normal range of motion.     Cervical back: Normal range of motion.  Skin:    General: Skin is warm and dry.     Findings: Rash present.     Comments: On the left lateral calf and on both thighs there are patches and streaks of deeply erythematous papules with small vesicles, some ruptured  Neurological:     Mental Status: She is alert.      UC Treatments / Results  Labs (all labs ordered are listed, but only abnormal results are displayed) Labs Reviewed - No data to display  EKG   Radiology No results found.  Procedures Procedures (including critical care time)  Medications Ordered in UC Medications - No data to display  Initial Impression / Assessment and Plan / UC Course  I have reviewed the triage vital signs and the nursing notes.  Pertinent labs & imaging results that were available during my care of the patient were reviewed by me and considered in my medical decision making (see chart for details).     Patient's been here twice now for her poison ivy rash.  It is apparently bothering her quite a bit.  I will give her prednisone in addition to her antihistamines Final Clinical Impressions(s) / UC Diagnoses   Final diagnoses:  Allergic contact dermatitis due to [place]     Discharge Instructions      Continue Benadryl at night May use any cream of benefit Take prednisone once a day for 5 days This may take 2 weeks to resolve completely   ED Prescriptions     Medication Sig Dispense  Auth. Provider  predniSONE (DELTASONE) 50 MG tablet Take once a day for 5 days.  Take with food 5 tablet Eustace Moore, MD      PDMP not reviewed this encounter.   Eustace Moore, MD 12/02/22 1041

## 2022-12-02 NOTE — ED Triage Notes (Signed)
Return visit for insect bite to [facility], now reports has what appears to be bites to right leg (thigh)

## 2022-12-04 ENCOUNTER — Encounter (HOSPITAL_BASED_OUTPATIENT_CLINIC_OR_DEPARTMENT_OTHER): Payer: Self-pay

## 2022-12-04 ENCOUNTER — Ambulatory Visit (HOSPITAL_BASED_OUTPATIENT_CLINIC_OR_DEPARTMENT_OTHER)
Admission: RE | Admit: 2022-12-04 | Discharge: 2022-12-04 | Disposition: A | Discharge status: Home / Self Care | Payer: 59 | Source: Ambulatory Visit | Attending: Family Medicine | Admitting: Family Medicine

## 2022-12-04 DIAGNOSIS — Z1231 Encounter for screening mammogram for malignant neoplasm of breast: Secondary | ICD-10-CM | POA: Diagnosis present

## 2022-12-10 ENCOUNTER — Encounter: Payer: Self-pay | Admitting: Medical-Surgical

## 2022-12-10 ENCOUNTER — Ambulatory Visit (INDEPENDENT_AMBULATORY_CARE_PROVIDER_SITE_OTHER): Payer: 59 | Admitting: Medical-Surgical

## 2022-12-10 VITALS — BP 129/72 | HR 90 | Resp 20 | Ht 70.0 in | Wt 168.4 lb

## 2022-12-10 DIAGNOSIS — Z7984 Long term (current) use of oral hypoglycemic drugs: Secondary | ICD-10-CM

## 2022-12-10 DIAGNOSIS — I10 Essential (primary) hypertension: Secondary | ICD-10-CM | POA: Diagnosis not present

## 2022-12-10 DIAGNOSIS — R21 Rash and other nonspecific skin eruption: Secondary | ICD-10-CM | POA: Diagnosis not present

## 2022-12-10 DIAGNOSIS — E119 Type 2 diabetes mellitus without complications: Secondary | ICD-10-CM | POA: Diagnosis not present

## 2022-12-10 DIAGNOSIS — E785 Hyperlipidemia, unspecified: Secondary | ICD-10-CM | POA: Diagnosis not present

## 2022-12-10 LAB — POCT UA - MICROALBUMIN
Albumin/Creatinine Ratio, Urine, POC: <30
Creatinine, POC: 100 mg/dL
Microalbumin Ur, POC: 30 mg/L

## 2022-12-10 LAB — POCT GLYCOSYLATED HEMOGLOBIN (HGB A1C)
HbA1c, POC (controlled diabetic range): 8.6 % — AB (ref 0.0–7.0)
Hemoglobin A1C: 8.6 % — AB (ref 4.0–5.6)

## 2022-12-10 MED ORDER — CLOBETASOL PROPIONATE 0.05 % EX OINT: 1.0000 | TOPICAL_OINTMENT | Freq: Two times a day (BID) | CUTANEOUS | 0 refills | Status: DC

## 2022-12-10 NOTE — Progress Notes (Signed)
        Established patient visit  History, exam, impression, and plan:  1. Type 2 diabetes mellitus without complication, without long-term current use of insulin [facility]) Pleasant 57 year old female presenting today for follow-up on type 2 diabetes.  She has been taking metformin 1000 mg a day but recently looked at the label and realized it said 1000 mg twice daily.  Not sure she has been taking it correctly.  Notes that her dietary habits have room for improvement and she is often snacking on the wrong foods.  Has had a schedule change at work which is taking some getting used to.  Previous hemoglobin A1c at 7.5% in March.  Today hemoglobin A1c at 8.6%.  POCT microalbumin normal.  Foot exam completed.  Checking CMP.  Recommend increasing metformin to 1000 mg twice daily and work on dietary compliance. - CMP14+EGFR - POCT HgB A1C - POCT UA - Microalbumin - HM Diabetes Foot Exam  2. Hyperlipidemia, unspecified hyperlipidemia type History of hyperlipidemia currently treated with pravastatin 40 mg daily, tolerating well without side effects.  As noted above, room for improvement on her dietary habits.  Checking lipids today.  Continue pravastatin as prescribed. - CMP14+EGFR - Lipid panel  3. Essential hypertension History of hypertension currently treated with lisinopril 10 mg daily, tolerating well without side effects.  Blood pressure is at goal today.  Denies concerning symptoms.  Cardiopulmonary exam normal.  Checking labs as below.  Continue lisinopril 10 mg daily. - CBC with Differential/Platelet - CMP14+EGFR - Lipid panel  4. Rash Recent issue with poison ivy/sumac exposure and associated rash.  She was seen at urgent care twice and on her last visit, she was prescribed a prednisone burst x 5 days.  Took this as prescribed but this was not helpful.  She was initially prescribed a steroid cream however it was a very small tube and was gone within 2 days.  On evaluation, symptoms consistent  with poison ivy rash.  Due to elevated A1c, hesitant to add further steroids.  Sending clobetasol ointment twice daily for up to 14 days.   Procedures performed this visit: None.  Return in about 3 months (around 03/11/2023) for DM/HTN/HLD follow up.  __________________________________ Thayer Ohm, DNP, APRN, FNP-BC Primary Care and Sports Medicine [facility] [place]

## 2022-12-12 LAB — CBC WITH DIFFERENTIAL/PLATELET
Basophils Absolute: 0.1 10*3/uL (ref 0.0–0.2)
Basos: 1 %
EOS (ABSOLUTE): 0.2 10*3/uL (ref 0.0–0.4)
Eos: 3 %
Hematocrit: 44.9 % (ref 34.0–46.6)
Hemoglobin: 14.2 g/dL (ref 11.1–15.9)
Immature Grans (Abs): 0 10*3/uL (ref 0.0–0.1)
Immature Granulocytes: 0 %
Lymphocytes Absolute: 2.7 10*3/uL (ref 0.7–3.1)
Lymphs: 31 %
MCH: 29 pg (ref 26.6–33.0)
MCHC: 31.6 g/dL (ref 31.5–35.7)
MCV: 92 fL (ref 79–97)
Monocytes Absolute: 0.7 10*3/uL (ref 0.1–0.9)
Monocytes: 8 %
Neutrophils Absolute: 4.9 10*3/uL (ref 1.4–7.0)
Neutrophils: 57 %
Platelets: 321 10*3/uL (ref 150–450)
RBC: 4.89 x10E6/uL (ref 3.77–5.28)
RDW: 12.5 % (ref 11.7–15.4)
WBC: 8.6 10*3/uL (ref 3.4–10.8)

## 2022-12-12 LAB — LIPID PANEL
Chol/HDL Ratio: 5.1 ratio — ABNORMAL HIGH (ref 0.0–4.4)
Cholesterol, Total: 184 mg/dL (ref 100–199)
HDL: 36 mg/dL — ABNORMAL LOW (ref >39)
LDL Chol Calc (NIH): 101 mg/dL — ABNORMAL HIGH (ref 0–99)
Triglycerides: 279 mg/dL — ABNORMAL HIGH (ref 0–149)
VLDL Cholesterol Cal: 47 mg/dL — ABNORMAL HIGH (ref 5–40)

## 2022-12-12 LAB — CMP14+EGFR
ALT: 22 IU/L (ref 0–32)
AST: 15 IU/L (ref 0–40)
Albumin: 4.4 g/dL (ref 3.8–4.9)
Alkaline Phosphatase: 95 IU/L (ref 44–121)
BUN/Creatinine Ratio: 22 (ref 9–23)
BUN: 15 mg/dL (ref 6–24)
Bilirubin Total: 0.5 mg/dL (ref 0.0–1.2)
CO2: 19 mmol/L — ABNORMAL LOW (ref 20–29)
Calcium: 9.3 mg/dL (ref 8.7–10.2)
Chloride: 98 mmol/L (ref 96–106)
Creatinine, Ser: 0.67 mg/dL (ref 0.57–1.00)
Globulin, Total: 2.5 g/dL (ref 1.5–4.5)
Glucose: 181 mg/dL — ABNORMAL HIGH (ref 70–99)
Potassium: 4.2 mmol/L (ref 3.5–5.2)
Sodium: 137 mmol/L (ref 134–144)
Total Protein: 6.9 g/dL (ref 6.0–8.5)
eGFR: 102 mL/min/{1.73_m2} (ref >59)

## 2022-12-16 ENCOUNTER — Other Ambulatory Visit: Payer: Self-pay | Admitting: Medical-Surgical

## 2022-12-16 DIAGNOSIS — F5101 Primary insomnia: Secondary | ICD-10-CM

## 2022-12-18 MED ORDER — PRAVASTATIN SODIUM 80 MG PO TABS
80.0000 mg | ORAL_TABLET | Freq: Every day | ORAL | 3 refills | Status: DC
Start: 2022-12-18 — End: 2023-10-21

## 2022-12-18 NOTE — Addendum Note (Signed)
Addended byChristen Butter on: 12/18/2022 01:54 PM   Modules accepted: Orders

## 2022-12-21 LAB — COLOGUARD: COLOGUARD: NEGATIVE

## 2023-01-13 ENCOUNTER — Other Ambulatory Visit: Payer: Self-pay | Admitting: Medical-Surgical

## 2023-01-13 DIAGNOSIS — F5101 Primary insomnia: Secondary | ICD-10-CM

## 2023-01-15 ENCOUNTER — Other Ambulatory Visit: Payer: Self-pay | Admitting: Medical-Surgical

## 2023-01-15 DIAGNOSIS — B001 Herpesviral vesicular dermatitis: Secondary | ICD-10-CM

## 2023-01-15 DIAGNOSIS — E119 Type 2 diabetes mellitus without complications: Secondary | ICD-10-CM

## 2023-01-24 ENCOUNTER — Ambulatory Visit
Admission: RE | Admit: 2023-01-24 | Discharge: 2023-01-24 | Disposition: A | Discharge status: Home / Self Care | Payer: 59 | Source: Ambulatory Visit | Attending: Family Medicine | Admitting: Family Medicine

## 2023-01-24 VITALS — BP 128/79 | HR 84 | Temp 98.2°F | Resp 16

## 2023-01-24 DIAGNOSIS — J0100 Acute maxillary sinusitis, unspecified: Secondary | ICD-10-CM | POA: Diagnosis not present

## 2023-01-24 DIAGNOSIS — J309 Allergic rhinitis, unspecified: Secondary | ICD-10-CM | POA: Diagnosis not present

## 2023-01-24 DIAGNOSIS — R059 Cough, unspecified: Secondary | ICD-10-CM

## 2023-01-24 MED ORDER — FEXOFENADINE HCL 180 MG PO TABS: 180.0000 mg | ORAL_TABLET | Freq: Every day | ORAL | 0 refills | Status: DC

## 2023-01-24 MED ORDER — PROMETHAZINE-DM 6.25-15 MG/5ML PO SYRP: 5.0000 mL | ORAL_SOLUTION | Freq: Two times a day (BID) | ORAL | 0 refills | Status: DC | PRN

## 2023-01-24 MED ORDER — AMOXICILLIN-POT CLAVULANATE 875-125 MG PO TABS: 1.0000 | ORAL_TABLET | Freq: Two times a day (BID) | ORAL | 0 refills | Status: DC

## 2023-01-24 MED ORDER — BENZONATATE 200 MG PO CAPS: 200.0000 mg | ORAL_CAPSULE | Freq: Three times a day (TID) | ORAL | 0 refills | Status: AC | PRN

## 2023-01-24 MED ORDER — PREDNISONE 20 MG PO TABS: ORAL_TABLET | ORAL | 0 refills | Status: DC

## 2023-01-24 NOTE — ED Provider Notes (Signed)
[facility]    CSN: [id number] Arrival date & time: 01/24/23  1252      History   Chief Complaint Chief Complaint  Patient presents with   Cough   Headache    HPI Barbara Lloyd is a 57 y.o. female.   HPI pleasant 57 year old female presents with cough and headache for 1 week.  Additionally, reports runny nose.  PMH significant for T2DM, primary insomnia, and HTN.  Past Medical History:  Diagnosis Date   COVID-19 03/30/2020   Diabetes (HCC)    High cholesterol    Hypertension     Patient Active Problem List   Diagnosis Date Noted   Chronic constipation 11/09/2022   Psoriasis 01/25/2021   Cold sore 01/25/2021   Primary insomnia 01/25/2021   Hidradenitis suppurativa 11/09/2019   History of tubal ligation 03/31/2019   History of endometrial ablation 03/31/2019   Adopted 03/31/2019   Influenza vaccination declined 03/31/2019   Hyperlipidemia 03/28/2016   Type 2 diabetes mellitus without complication, without long-term current use of insulin (HCC) 03/27/2016   Essential hypertension 08/16/2015   Dental caries 08/16/2015    Past Surgical History:  Procedure Laterality Date   CESAREAN SECTION     x2   ENDOMETRIAL ABLATION     2006   FOOT SURGERY     GANGLION CYST EXCISION      OB History   No obstetric history on file.      Home Medications    Prior to Admission medications   Medication Sig Start Date End Date Taking? Authorizing Provider  amoxicillin-clavulanate (AUGMENTIN) 875-125 MG tablet Take 1 tablet by mouth every 12 (twelve) hours. 01/24/23  Yes Trevor Iha, FNP  benzonatate (TESSALON) 200 MG capsule Take 1 capsule (200 mg total) by mouth 3 (three) times daily as needed for up to 7 days. 01/24/23 01/31/23 Yes Trevor Iha, FNP  fexofenadine [facility] ALLERGY) 180 MG tablet Take 1 tablet (180 mg total) by mouth daily for 15 days. 01/24/23 02/08/23 Yes Trevor Iha, FNP  predniSONE (DELTASONE) 20 MG tablet Take 3 tabs PO daily x 5  days. 01/24/23  Yes Trevor Iha, FNP  promethazine-dextromethorphan (PROMETHAZINE-DM) 6.25-15 MG/5ML syrup Take 5 mLs by mouth 2 (two) times daily as needed for cough. 01/24/23  Yes Trevor Iha, FNP  clobetasol ointment (TEMOVATE) 0.05 % Apply 1 Application topically 2 (two) times daily. 12/10/22   [name], NP  linaclotide [facility]) 72 MCG capsule Take 1 capsule (72 mcg total) by mouth daily before breakfast. 11/09/22   [name] S, DO  lisinopril (ZESTRIL) 10 MG tablet TAKE 1 TABLET (10 MG TOTAL) BY MOUTH DAILY. NEEDS APPOINTMENT FOR FURTHER REFILLS. 11/12/22   [name], NP  metFORMIN (GLUCOPHAGE) 1000 MG tablet TAKE 1 TABLET BY MOUTH 2 TIMES DAILY WITH A MEAL. 08/20/22   [name], NP  pravastatin (PRAVACHOL) 80 MG tablet Take 1 tablet (80 mg total) by mouth daily. 12/18/22   [name], NP  traZODone (DESYREL) 100 MG tablet TAKE 1 TABLET BY MOUTH EVERY DAY AT BEDTIME AS NEEDED FOR SLEEP 01/14/23   [name], NP  valACYclovir (VALTREX) 1000 MG tablet TAKE 2 TABS BY MOUTH EVERY 12 HOURS FOR 1 DAY 01/16/23   [name], NP    Family History Family History  Adopted: Yes    Social History Social History   Tobacco Use   Smoking status: Every Day    Current packs/day: 0.00    Average packs/day: 0.5 packs/day for 25.0 years (12.5 ttl pk-yrs)  Types: Cigarettes    Start date: 04/07/1995    Last attempt to quit: 04/06/2020    Years since quitting: 2.8   Smokeless tobacco: Never  Vaping Use   Vaping status: Never Used  Substance Use Topics   Alcohol use: Yes    Alcohol/week: 3.0 standard drinks of alcohol    Types: 3 Standard drinks or equivalent per week    Comment: occ   Drug use: No     Allergies   Ciprofloxacin and Sulfa antibiotics   Review of Systems Review of Systems  Respiratory:  Positive for cough.   Neurological:  Positive for headaches.  All other systems reviewed and are negative.    Physical Exam Triage Vital Signs ED Triage Vitals  Encounter  Vitals Group     BP 01/24/23 1302 128/79     Systolic BP Percentile --      Diastolic BP Percentile --      Pulse Rate 01/24/23 1302 84     Resp 01/24/23 1302 16     Temp 01/24/23 1302 98.2 F (36.8 C)     Temp Source 01/24/23 1302 Oral     SpO2 01/24/23 1302 99 %     Weight --      Height --      Head Circumference --      Peak Flow --      Pain Score 01/24/23 1303 0     Pain Loc --      Pain Education --      Exclude from Growth Chart --    No data found.  Updated Vital Signs BP 128/79 (BP Location: Right Arm)   Pulse 84   Temp 98.2 F (36.8 C) (Oral)   Resp 16   SpO2 99%       Physical Exam Vitals and nursing note reviewed.  Constitutional:      Appearance: Normal appearance. She is obese. She is ill-appearing.  HENT:     Head: Normocephalic and atraumatic.     Right Ear: Tympanic membrane and external ear normal.     Left Ear: Tympanic membrane and external ear normal.     Nose:     Comments: Turbinates are eyrthematous    Mouth/Throat:     Mouth: Mucous membranes are moist.     Pharynx: Oropharynx is clear.     Comments: Significant amount of clear drainage of posterior oropharynx noted Eyes:     Extraocular Movements: Extraocular movements intact.     Conjunctiva/sclera: Conjunctivae normal.     Pupils: Pupils are equal, round, and reactive to light.  Cardiovascular:     Rate and Rhythm: Normal rate and regular rhythm.     Pulses: Normal pulses.     Heart sounds: Normal heart sounds. No murmur heard. Pulmonary:     Effort: Pulmonary effort is normal.     Breath sounds: Normal breath sounds. No wheezing, rhonchi or rales.     Comments: Infrequent non-productive cough on exam Musculoskeletal:        General: Normal range of motion.     Cervical back: Normal range of motion and neck supple.  Skin:    General: Skin is warm and dry.  Neurological:     General: No focal deficit present.     Mental Status: She is alert and oriented to person, place, and  time. Mental status is at baseline.  Psychiatric:        Mood and Affect: Mood normal.  Behavior: Behavior normal.      UC Treatments / Results  Labs (all labs ordered are listed, but only abnormal results are displayed) Labs Reviewed - No data to display  EKG   Radiology No results found.  Procedures Procedures (including critical care time)  Medications Ordered in UC Medications - No data to display  Initial Impression / Assessment and Plan / UC Course  I have reviewed the triage vital signs and the nursing notes.  Pertinent labs & imaging results that were available during my care of the patient were reviewed by me and considered in my medical decision making (see chart for details).     MDM: 1.  Acute maxillary sinusitis, recurrence not specified-Rx'd Augmentin 875/125 mg tablet: Take 1 tablet twice daily x 10 days; 2.  Cough, unspecified type-Rx'd Prednisone: Take 3 tablets p.o. daily x 5 days, Rx'd Tessalon 200 mg capsules: Take 1 capsule 3 times daily, as needed for cough; 3.  Allergic rhinitis, unspecified seasonality, unspecified trigger-Rx'd Allegra 180 mg fexofenadine tablet daily x 4-5 days. Patient to take medications as directed with food to completion.  Advised patient to take Allegra and prednisone with first dose of Augmentin for the next 5 of 7 days.  Instructed patient may discontinue Allegra after 4 to 5 days and use as needed for concurrent postnasal drainage.  Advised may take Tessalon capsules daily or as needed for cough.  Advised may use promethazine DM at night for cough prior to sleep due to sedate of effects.  Encouraged increase daily water intake to 64 ounces per day while taking these medications.  Advised if symptoms worsen and/or unresolved please follow-up with your PCP or here for further evaluation.   Final Clinical Impressions(s) / UC Diagnoses   Final diagnoses:  Cough, unspecified type  Acute maxillary sinusitis, recurrence not  specified  Allergic rhinitis, unspecified seasonality, unspecified trigger     Discharge Instructions      Patient to take medications as directed with food to completion.  Advised patient to take Allegra and prednisone with first dose of Augmentin for the next 5 of 7 days.  Instructed patient may discontinue Allegra after 4 to 5 days and use as needed for concurrent postnasal drainage.  Advised may take Tessalon capsules daily or as needed for cough.  Advised may use promethazine DM at night for cough prior to sleep due to sedate of effects.  Encouraged increase daily water intake to 64 ounces per day while taking these medications.  Advised if symptoms worsen and/or unresolved please follow-up with your PCP or here for further evaluation.     ED Prescriptions     Medication Sig Dispense Auth. Provider   amoxicillin-clavulanate (AUGMENTIN) 875-125 MG tablet Take 1 tablet by mouth every 12 (twelve) hours. 14 tablet Trevor Iha, FNP   predniSONE (DELTASONE) 20 MG tablet Take 3 tabs PO daily x 5 days. 15 tablet Trevor Iha, FNP   fexofenadine [facility] ALLERGY) 180 MG tablet Take 1 tablet (180 mg total) by mouth daily for 15 days. 15 tablet Trevor Iha, FNP   benzonatate (TESSALON) 200 MG capsule Take 1 capsule (200 mg total) by mouth 3 (three) times daily as needed for up to 7 days. 40 capsule Trevor Iha, FNP   promethazine-dextromethorphan (PROMETHAZINE-DM) 6.25-15 MG/5ML syrup Take 5 mLs by mouth 2 (two) times daily as needed for cough. 118 mL Trevor Iha, FNP      PDMP not reviewed this encounter.   Trevor Iha, FNP 01/24/23 1353

## 2023-01-24 NOTE — Discharge Instructions (Addendum)
Patient to take medications as directed with food to completion.  Advised patient to take Allegra and prednisone with first dose of Augmentin for the next 5 of 7 days.  Instructed patient may discontinue Allegra after 4 to 5 days and use as needed for concurrent postnasal drainage.  Advised may take Tessalon capsules daily or as needed for cough.  Advised may use promethazine DM at night for cough prior to sleep due to sedate of effects.  Encouraged increase daily water intake to 64 ounces per day while taking these medications.  Advised if symptoms worsen and/or unresolved please follow-up with your PCP or here for further evaluation.

## 2023-01-24 NOTE — ED Triage Notes (Signed)
Pt c/o cough and HA x 1 week. Also runny nose. OTC cough and nyquil prn.

## 2023-02-10 ENCOUNTER — Other Ambulatory Visit: Payer: Self-pay | Admitting: Medical-Surgical

## 2023-02-10 DIAGNOSIS — E119 Type 2 diabetes mellitus without complications: Secondary | ICD-10-CM

## 2023-02-10 DIAGNOSIS — F5101 Primary insomnia: Secondary | ICD-10-CM

## 2023-03-11 ENCOUNTER — Ambulatory Visit: Payer: 59 | Admitting: Medical-Surgical

## 2023-03-15 ENCOUNTER — Other Ambulatory Visit: Payer: Self-pay | Admitting: Medical-Surgical

## 2023-03-15 DIAGNOSIS — F5101 Primary insomnia: Secondary | ICD-10-CM

## 2023-03-15 DIAGNOSIS — E119 Type 2 diabetes mellitus without complications: Secondary | ICD-10-CM

## 2023-03-17 ENCOUNTER — Other Ambulatory Visit: Payer: Self-pay

## 2023-03-17 ENCOUNTER — Ambulatory Visit
Admission: RE | Admit: 2023-03-17 | Discharge: 2023-03-17 | Disposition: A | Discharge status: Home / Self Care | Payer: 59 | Source: Ambulatory Visit | Attending: Family Medicine | Admitting: Family Medicine

## 2023-03-17 VITALS — BP 149/80 | HR 82 | Temp 98.6°F | Resp 16

## 2023-03-17 DIAGNOSIS — R059 Cough, unspecified: Secondary | ICD-10-CM | POA: Diagnosis not present

## 2023-03-17 DIAGNOSIS — J069 Acute upper respiratory infection, unspecified: Secondary | ICD-10-CM | POA: Diagnosis not present

## 2023-03-17 MED ORDER — PREDNISONE 20 MG PO TABS: ORAL_TABLET | ORAL | 0 refills | Status: DC

## 2023-03-17 MED ORDER — PROMETHAZINE-DM 6.25-15 MG/5ML PO SYRP: 5.0000 mL | ORAL_SOLUTION | Freq: Two times a day (BID) | ORAL | 0 refills | Status: DC | PRN

## 2023-03-17 MED ORDER — BENZONATATE 200 MG PO CAPS: 200.0000 mg | ORAL_CAPSULE | Freq: Three times a day (TID) | ORAL | 0 refills | Status: AC | PRN

## 2023-03-17 MED ORDER — DOXYCYCLINE HYCLATE 100 MG PO CAPS: 100.0000 mg | ORAL_CAPSULE | Freq: Two times a day (BID) | ORAL | 0 refills | Status: AC

## 2023-03-17 NOTE — ED Provider Notes (Signed)
[facility]    CSN: [id number] Arrival date & time: 03/17/23  1156      History   Chief Complaint Chief Complaint  Patient presents with   Cough    Entered by patient    HPI Barbara Lloyd is a 57 y.o. female.   HPI 57 year old female presents with fatigue, rhinorrhea cough and nausea for 2 days.  PMH significant for HTN, chronic constipation, and T2DM.  Past Medical History:  Diagnosis Date   COVID-19 03/30/2020   Diabetes (HCC)    High cholesterol    Hypertension     Patient Active Problem List   Diagnosis Date Noted   Chronic constipation 11/09/2022   Psoriasis 01/25/2021   Cold sore 01/25/2021   Primary insomnia 01/25/2021   Hidradenitis suppurativa 11/09/2019   History of tubal ligation 03/31/2019   History of endometrial ablation 03/31/2019   Adopted 03/31/2019   Influenza vaccination declined 03/31/2019   Hyperlipidemia 03/28/2016   Type 2 diabetes mellitus without complication, without long-term current use of insulin (HCC) 03/27/2016   Essential hypertension 08/16/2015   Dental caries 08/16/2015    Past Surgical History:  Procedure Laterality Date   CESAREAN SECTION     x2   ENDOMETRIAL ABLATION     2006   FOOT SURGERY     GANGLION CYST EXCISION      OB History   No obstetric history on file.      Home Medications    Prior to Admission medications   Medication Sig Start Date End Date Taking? Authorizing Provider  benzonatate (TESSALON) 200 MG capsule Take 1 capsule (200 mg total) by mouth 3 (three) times daily as needed for up to 7 days. 03/17/23 03/24/23 Yes Trevor Iha, FNP  doxycycline (VIBRAMYCIN) 100 MG capsule Take 1 capsule (100 mg total) by mouth 2 (two) times daily for 7 days. 03/17/23 03/24/23 Yes Trevor Iha, FNP  predniSONE (DELTASONE) 20 MG tablet Take 3 tabs PO daily x 5 days. 03/17/23  Yes Trevor Iha, FNP  promethazine-dextromethorphan (PROMETHAZINE-DM) 6.25-15 MG/5ML syrup Take 5 mLs by mouth 2 (two)  times daily as needed for cough. 03/17/23  Yes Trevor Iha, FNP  clobetasol ointment (TEMOVATE) 0.05 % Apply 1 Application topically 2 (two) times daily. 12/10/22   [name], NP  fexofenadine (ALLEGRA ALLERGY) 180 MG tablet Take 1 tablet (180 mg total) by mouth daily for 15 days. 01/24/23 02/08/23  Trevor Iha, FNP  linaclotide (LINZESS) 72 MCG capsule Take 1 capsule (72 mcg total) by mouth daily before breakfast. 11/09/22   [name] S, DO  lisinopril (ZESTRIL) 10 MG tablet TAKE 1 TABLET (10 MG TOTAL) BY MOUTH DAILY. NEEDS APPOINTMENT FOR FURTHER REFILLS. 11/12/22   [name], NP  metFORMIN (GLUCOPHAGE) 1000 MG tablet TAKE 1 TABLET BY MOUTH TWICE A DAY WITH FOOD 02/11/23   [name], NP  pravastatin (PRAVACHOL) 80 MG tablet Take 1 tablet (80 mg total) by mouth daily. 12/18/22   [name], NP  traZODone (DESYREL) 100 MG tablet TAKE 1 TABLET BY MOUTH EVERY DAY AT BEDTIME AS NEEDED FOR SLEEP 02/11/23   [name], NP  valACYclovir (VALTREX) 1000 MG tablet TAKE 2 TABS BY MOUTH EVERY 12 HOURS FOR 1 DAY 01/16/23   [name], NP    Family History Family History  Adopted: Yes  Problem Relation Age of Onset   Healthy Mother    Healthy Father     Social History Social History   Tobacco Use   Smoking status: Every  Day    Current packs/day: 0.00    Average packs/day: 0.5 packs/day for 25.0 years (12.5 ttl pk-yrs)    Types: Cigarettes    Start date: 04/07/1995    Last attempt to quit: 04/06/2020    Years since quitting: 2.9   Smokeless tobacco: Never  Vaping Use   Vaping status: Never Used  Substance Use Topics   Alcohol use: Yes    Alcohol/week: 3.0 standard drinks of alcohol    Types: 3 Standard drinks or equivalent per week    Comment: occ   Drug use: No     Allergies   Ciprofloxacin and Sulfa antibiotics   Review of Systems Review of Systems  Constitutional:  Positive for fatigue.  HENT:  Positive for rhinorrhea.   Respiratory:  Positive for cough.    Gastrointestinal:  Positive for nausea.  All other systems reviewed and are negative.    Physical Exam Triage Vital Signs ED Triage Vitals  Encounter Vitals Group     BP 03/17/23 1223 (!) 149/80     Systolic BP Percentile --      Diastolic BP Percentile --      Pulse Rate 03/17/23 1223 82     Resp 03/17/23 1223 16     Temp 03/17/23 1223 98.6 F (37 C)     Temp Source 03/17/23 1223 Oral     SpO2 03/17/23 1223 98 %     Weight --      Height --      Head Circumference --      Peak Flow --      Pain Score 03/17/23 1221 0     Pain Loc --      Pain Education --      Exclude from Growth Chart --    No data found.  Updated Vital Signs BP (!) 149/80   Pulse 82   Temp 98.6 F (37 C) (Oral)   Resp 16   SpO2 98%   Physical Exam Vitals and nursing note reviewed.  Constitutional:      Appearance: Normal appearance. She is obese. She is ill-appearing.  HENT:     Head: Normocephalic and atraumatic.     Right Ear: Tympanic membrane, ear canal and external ear normal.     Left Ear: Tympanic membrane, ear canal and external ear normal.     Mouth/Throat:     Mouth: Mucous membranes are moist.     Pharynx: Oropharynx is clear.  Eyes:     Extraocular Movements: Extraocular movements intact.     Conjunctiva/sclera: Conjunctivae normal.     Pupils: Pupils are equal, round, and reactive to light.  Cardiovascular:     Rate and Rhythm: Normal rate and regular rhythm.     Pulses: Normal pulses.     Heart sounds: Normal heart sounds.  Pulmonary:     Effort: Pulmonary effort is normal.     Breath sounds: Normal breath sounds. No wheezing, rhonchi or rales.     Comments: Infrequent nonproductive cough on exam Musculoskeletal:        General: Normal range of motion.     Cervical back: Normal range of motion and neck supple.  Skin:    General: Skin is warm and dry.  Neurological:     General: No focal deficit present.     Mental Status: She is alert and oriented to person, place,  and time. Mental status is at baseline.  Psychiatric:        Mood and  Affect: Mood normal.        Behavior: Behavior normal.        Thought Content: Thought content normal.      UC Treatments / Results  Labs (all labs ordered are listed, but only abnormal results are displayed) Labs Reviewed - No data to display  EKG   Radiology No results found.  Procedures Procedures (including critical care time)  Medications Ordered in UC Medications - No data to display  Initial Impression / Assessment and Plan / UC Course  I have reviewed the triage vital signs and the nursing notes.  Pertinent labs & imaging results that were available during my care of the patient were reviewed by me and considered in my medical decision making (see chart for details).     MDM: 1.  Acute upper respiratory infection-Rx'd doxycycline 100 mg capsule: Take 1 capsule twice daily x 7 days; 2.  Cough, unspecified type-Rx'd prednisone 20 mg tablet: Take 3 tabs p.o. daily x 5 days, Rx Tessalon 200 mg capsules: Take 1 capsule 3 times daily, as needed for cough, Rx'd Promethazine DM 6.25-15 Mg/5 mL syrup: Take 5 mL twice daily twice daily, as needed for cough. Advised patient to take medications as directed with food to completion.  Advised patient to take prednisone with first dose of doxycycline for the next 5 of 7 days.  Advised may use Tessalon capsules daily or as needed for cough.  Advised may use Promethazine DM at night for cough prior to sleep due to sedative effects.  Encouraged increase daily water intake to 64 ounces per day while taking his medications.  Advised if symptoms worsen and/or unresolved please follow-up PCP or here for further evaluation. Final Clinical Impressions(s) / UC Diagnoses   Final diagnoses:  Cough, unspecified type  Acute upper respiratory infection     Discharge Instructions      Advised patient to take medications as directed with food to completion.  Advised patient to  take prednisone with first dose of doxycycline for the next 5 of 7 days.  Advised may use Tessalon capsules daily or as needed for cough.  Advised may use Promethazine DM at night for cough prior to sleep due to sedative effects.  Encouraged increase daily water intake to 64 ounces per day while taking his medications.  Advised if symptoms worsen and/or unresolved please follow-up PCP or here for further evaluation.     ED Prescriptions     Medication Sig Dispense Auth. Provider   doxycycline (VIBRAMYCIN) 100 MG capsule Take 1 capsule (100 mg total) by mouth 2 (two) times daily for 7 days. 14 capsule Trevor Iha, FNP   predniSONE (DELTASONE) 20 MG tablet Take 3 tabs PO daily x 5 days. 15 tablet Trevor Iha, FNP   benzonatate (TESSALON) 200 MG capsule Take 1 capsule (200 mg total) by mouth 3 (three) times daily as needed for up to 7 days. 40 capsule Trevor Iha, FNP   promethazine-dextromethorphan (PROMETHAZINE-DM) 6.25-15 MG/5ML syrup Take 5 mLs by mouth 2 (two) times daily as needed for cough. 118 mL Trevor Iha, FNP      PDMP not reviewed this encounter.   Trevor Iha, FNP 03/17/23 1313

## 2023-03-17 NOTE — ED Triage Notes (Signed)
Pt presents to uc with co of cough, fever, nausea, fatigue, runny nose, and congestion since Friday. Pt reports she has been using nyquill, dayquill, motrin and musinex for symptoms. Pt reports she works in close proximity to others who have been ill recently.

## 2023-03-17 NOTE — Discharge Instructions (Addendum)
Advised patient to take medications as directed with food to completion.  Advised patient to take prednisone with first dose of doxycycline for the next 5 of 7 days.  Advised may use Tessalon capsules daily or as needed for cough.  Advised may use Promethazine DM at night for cough prior to sleep due to sedative effects.  Encouraged increase daily water intake to 64 ounces per day while taking his medications.  Advised if symptoms worsen and/or unresolved please follow-up PCP or here for further evaluation.

## 2023-03-18 NOTE — Telephone Encounter (Signed)
Copied from CRM [id number]. Topic: Appointments - Scheduling Inquiry for Clinic >> Mar 18, 2023 10:47 AM Dennison Nancy wrote: Reason for CRM: [name] parent of Barbara Lloyd who is a new patient with [name] , [name] is a patient of [name] and want to know if patient who is her daughtercan be establish with [name] in the future , [name] stated that [name] can send her a message on [employer] phone [phone]

## 2023-04-01 IMAGING — MG MM DIGITAL SCREENING BILAT W/ TOMO AND CAD
8 series · 9 of 24 positions shown · non-contrast
Comparison: Previous exam(s).

CLINICAL DATA: Screening.

EXAM:
DIGITAL SCREENING BILATERAL MAMMOGRAM WITH TOMOSYNTHESIS AND CAD
TECHNIQUE: Bilateral screening digital craniocaudal and mediolateral oblique
mammograms were obtained. Bilateral screening digital breast
tomosynthesis was performed. The images were evaluated with
computer-aided detection.

[R MLO synth-2D]
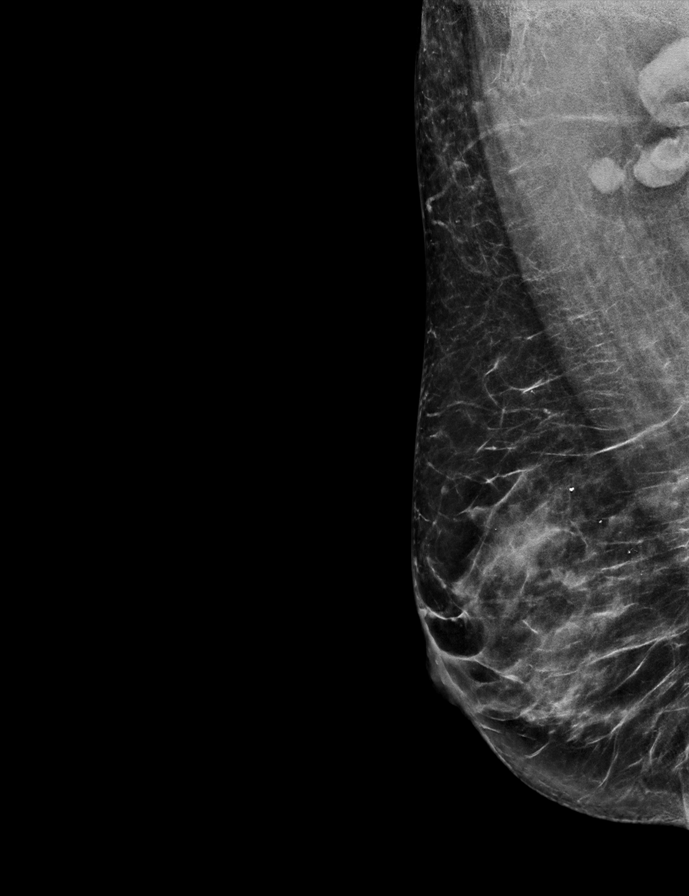

[L MLO synth-2D]
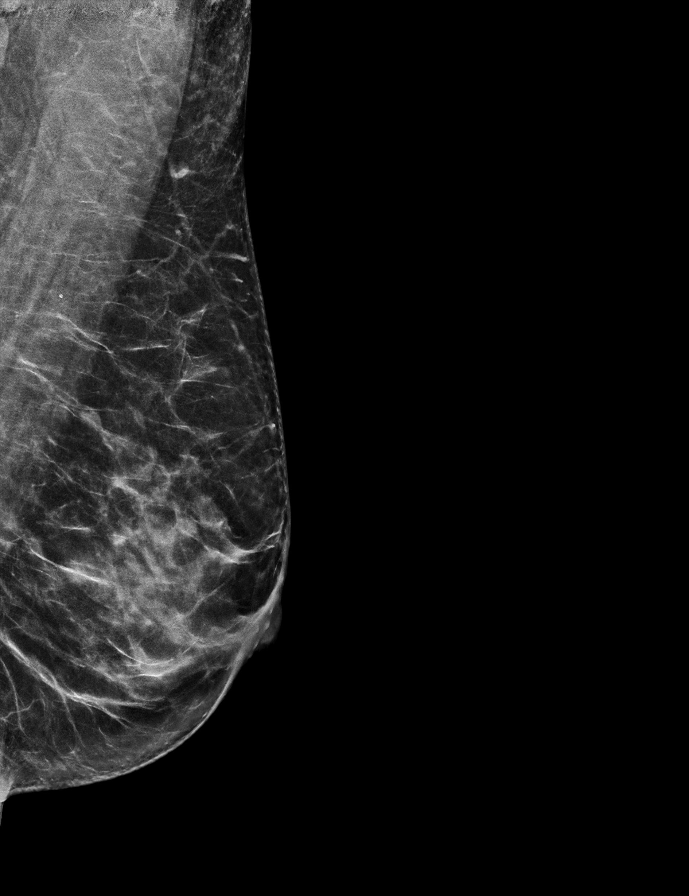

[R CC synth-2D]
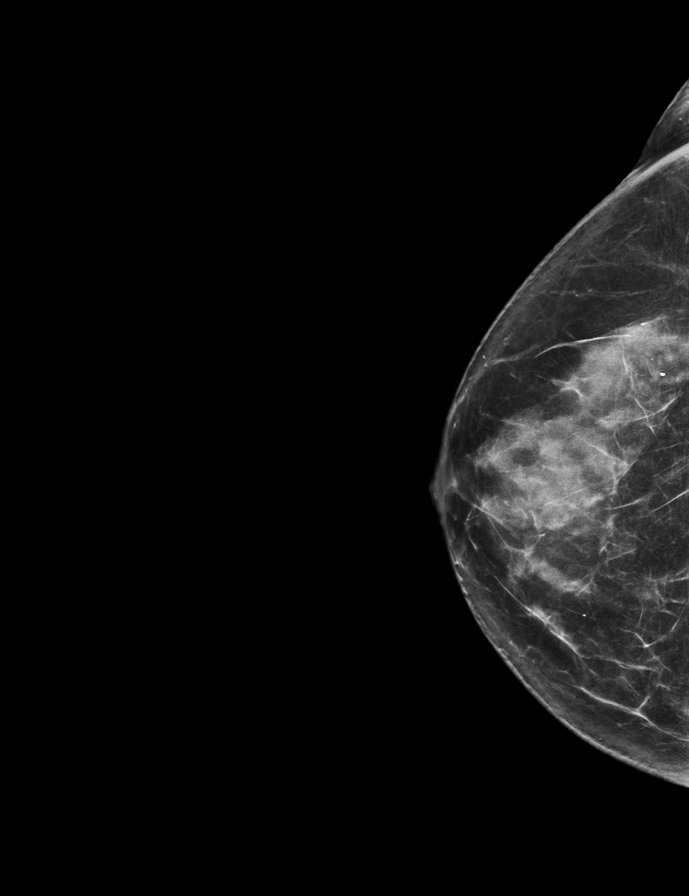

[L CC synth-2D]
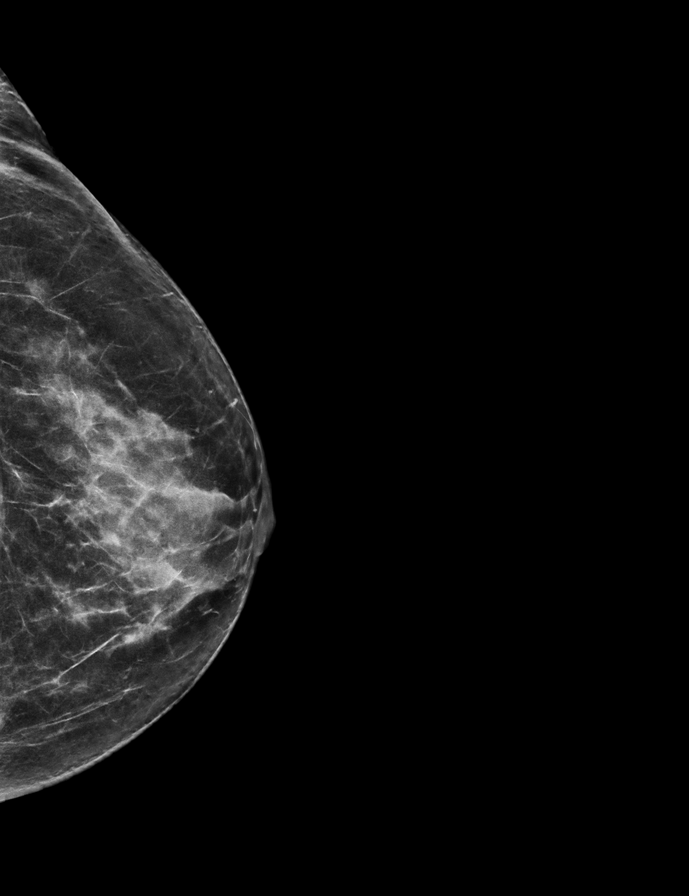

[L CC tomo · 2 of 58 frames shown]
[frame 19/58]
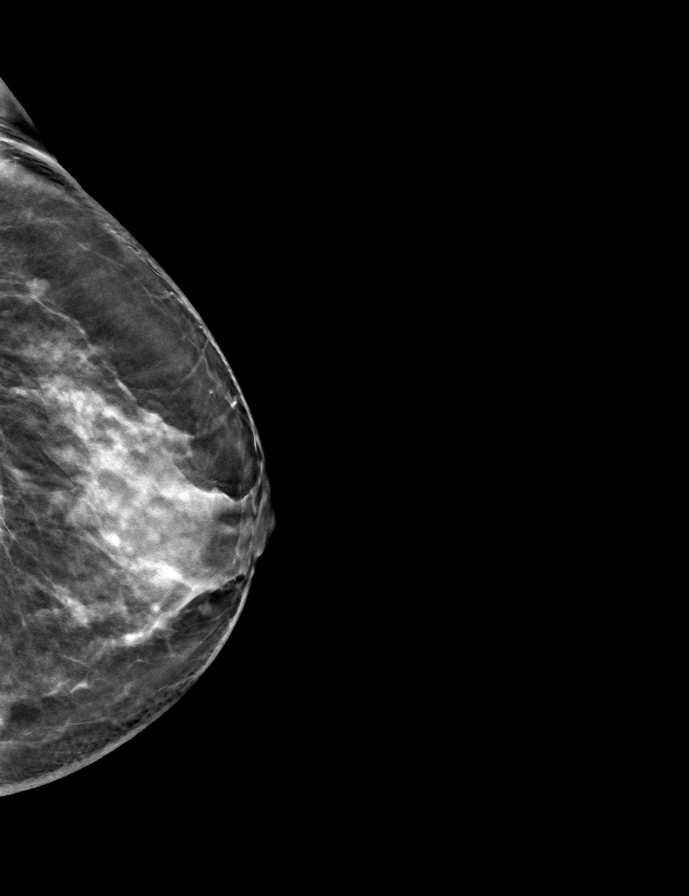
[frame 29/58]
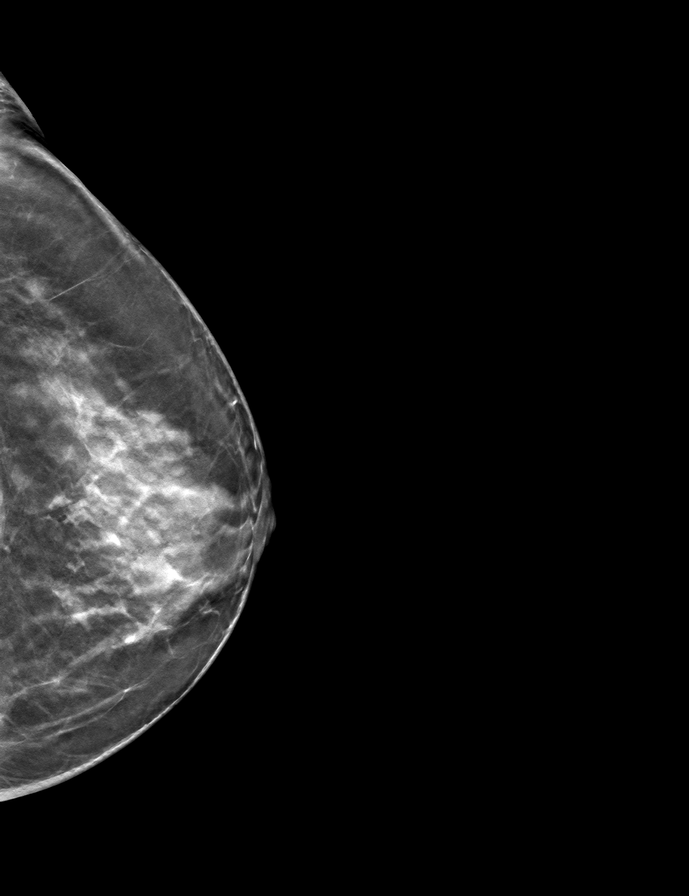

[L MLO tomo · tomo slice 30/59.0]
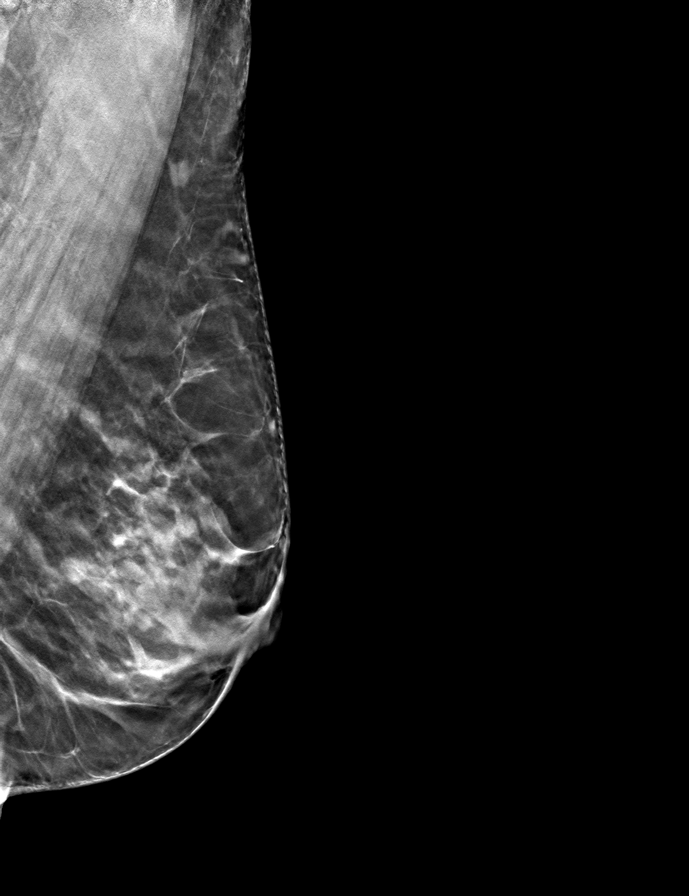

[R MLO tomo · tomo slice 33/65.0]
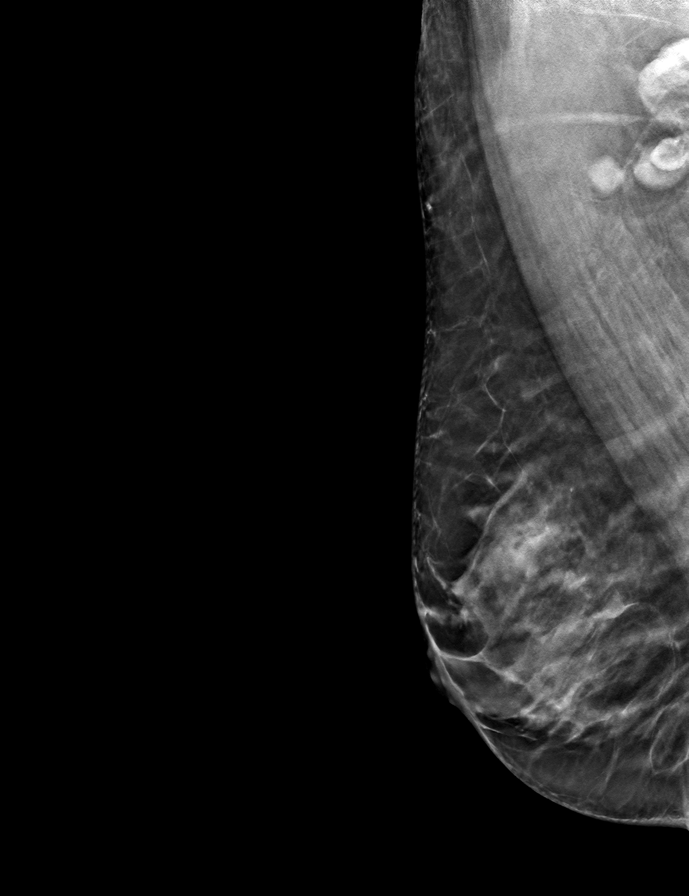

[R CC tomo · tomo slice 31/62.0]
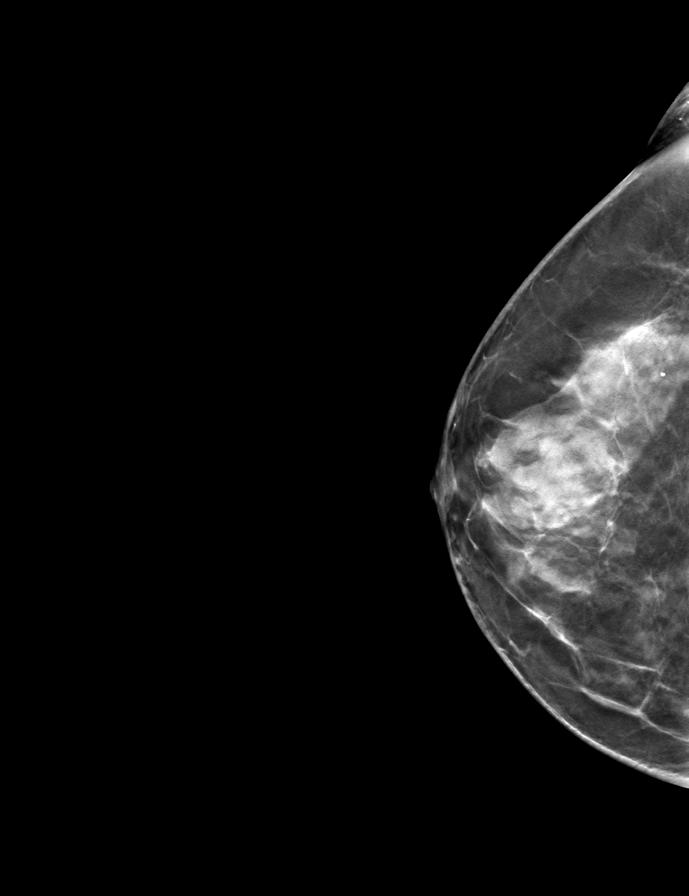

[9 of 24 positions shown; findings below may reference images not displayed]

ACR Breast Density Category c: The breast tissue is heterogeneously
dense, which may obscure small masses.
FINDINGS: In the right axilla, possible adenopathy warrants further
evaluation. In the left breast, no findings suspicious for
malignancy.
IMPRESSION: Further evaluation is suggested for possible adenopathy in the right
axilla.

RECOMMENDATION:
Ultrasound of the right axilla.  (Code:TB-N-RRR)

The patient will be contacted regarding the findings, and additional
imaging will be scheduled.

BI-RADS CATEGORY  0: Incomplete. Need additional imaging evaluation
and/or prior mammograms for comparison.

## 2023-04-08 ENCOUNTER — Other Ambulatory Visit: Payer: Self-pay | Admitting: Medical-Surgical

## 2023-04-08 DIAGNOSIS — I10 Essential (primary) hypertension: Secondary | ICD-10-CM

## 2023-04-15 ENCOUNTER — Encounter: Payer: Self-pay | Admitting: Medical-Surgical

## 2023-04-15 ENCOUNTER — Ambulatory Visit (INDEPENDENT_AMBULATORY_CARE_PROVIDER_SITE_OTHER): Payer: 59 | Admitting: Medical-Surgical

## 2023-04-15 VITALS — BP 128/73 | HR 89 | Resp 20 | Ht 70.0 in | Wt 170.1 lb

## 2023-04-15 DIAGNOSIS — R053 Chronic cough: Secondary | ICD-10-CM | POA: Insufficient documentation

## 2023-04-15 DIAGNOSIS — E119 Type 2 diabetes mellitus without complications: Secondary | ICD-10-CM | POA: Diagnosis not present

## 2023-04-15 DIAGNOSIS — I10 Essential (primary) hypertension: Secondary | ICD-10-CM | POA: Diagnosis not present

## 2023-04-15 DIAGNOSIS — E1169 Type 2 diabetes mellitus with other specified complication: Secondary | ICD-10-CM

## 2023-04-15 DIAGNOSIS — F5101 Primary insomnia: Secondary | ICD-10-CM | POA: Diagnosis not present

## 2023-04-15 DIAGNOSIS — E785 Hyperlipidemia, unspecified: Secondary | ICD-10-CM

## 2023-04-15 DIAGNOSIS — Z7984 Long term (current) use of oral hypoglycemic drugs: Secondary | ICD-10-CM

## 2023-04-15 LAB — POCT GLYCOSYLATED HEMOGLOBIN (HGB A1C)
HbA1c, POC (controlled diabetic range): 9 % — AB (ref 0.0–7.0)
Hemoglobin A1C: 9 % — AB (ref 4.0–5.6)

## 2023-04-15 MED ORDER — TRAZODONE HCL 100 MG PO TABS
ORAL_TABLET | ORAL | 0 refills | Status: DC
Start: 2023-04-15 — End: 2023-06-17

## 2023-04-15 MED ORDER — LISINOPRIL 10 MG PO TABS
10.0000 mg | ORAL_TABLET | Freq: Every day | ORAL | 1 refills | Status: DC
Start: 2023-04-15 — End: 2023-10-09

## 2023-04-15 MED ORDER — PROMETHAZINE-DM 6.25-15 MG/5ML PO SYRP: 5.0000 mL | ORAL_SOLUTION | Freq: Two times a day (BID) | ORAL | 0 refills | Status: DC | PRN

## 2023-04-15 MED ORDER — METFORMIN HCL 1000 MG PO TABS
1000.0000 mg | ORAL_TABLET | Freq: Two times a day (BID) | ORAL | 3 refills | Status: AC
Start: 2023-04-15 — End: ?

## 2023-04-15 MED ORDER — OZEMPIC (0.25 OR 0.5 MG/DOSE) 2 MG/3ML ~~LOC~~ SOPN
PEN_INJECTOR | SUBCUTANEOUS | 0 refills | Status: DC
Start: 2023-04-15 — End: 2023-05-27

## 2023-04-15 NOTE — Progress Notes (Signed)
Medical screening examination/treatment was performed by qualified clinical staff member and as supervising provider I was immediately available for consultation/collaboration. I have reviewed documentation and agree with assessment and plan.  Thayer Ohm, DNP, APRN, FNP-BC [facility]

## 2023-04-15 NOTE — Progress Notes (Signed)
Subjective:  Patient ID: Barbara Lloyd, female    DOB: [birth year], 58 y.o.   MRN: [id number]  Patient Care Team: [name], NP as PCP - General (Nurse Practitioner)   Chief Complaint:  Diabetes and Hypertension   HPI:  Barbara Lloyd is a 58 y.o. female presenting on 04/15/2023 for Diabetes and Hypertension   History, Exam,  Impression and Plan HPI  1. Hyperlipidemia associated with type 2 diabetes mellitus (HCC) (Primary) Pt states not taking evening Metformin all the time estimates that she takes 75% of the  evening dose time. Pt doesn't check her CBG at home and states "I eat 8 times a day" Pt has knowledge deficit regarding foods to decrease when trying to manage diabetes.  - POCT HgB A1C was 9.0% today Diabetes is poorly controlled. - Start Semaglutide,0.25 or 0.5MG /DOS, (OZEMPIC, 0.25 OR 0.5 MG/DOSE,) 2 MG/3ML SOPN; Inject 0.25mg  weekly for 4 weeks then 0.5mg  weekly thereafter.  Dispense: 3 mL; Refill: 0 - Continue and refill metformin (GLUCOPHAGE) 1000 MG tablet; Take 1 tablet (1,000 mg total) by mouth 2 (two) times daily with a meal.  Dispense: 180 tablet; Refill: 3 - Lipid panel  2. Essential hypertension Pt taking lisinopril as directed in the morning. Denies chest pain lower extremity edema palpitations or dizziness/lightheadedness.  BP well managed.  - CMP14+EGFR - Lipid panel - Continue end refill lisinopril (ZESTRIL) 10 MG tablet; Take 1 tablet (10 mg total) by mouth daily. NEEDS APPOINTMENT FOR FURTHER REFILLS.  Dispense: 90 tablet; Refill: 1  3. Primary insomnia Pt report taking trazodone as directed and states that it works well. Pt is not interested in changing regimen at this time - traZODone (DESYREL) 100 MG tablet; TAKE 1 TABLET BY MOUTH EVERY DAY AT BEDTIME AS NEEDED FOR SLEEP  Dispense: 30 tablet; Refill: 0  4. Chronic cough Pt c/o cough that started in November of 2024 and states that "it just wont go away" despite patient using OTC dayquil/nyquil  mucinex and tessalon pears. Pt states that the only thing that really works on the cough is  the propromethazine-dextromethorphan cough syrup and requesting refill. Pt I is a smoker who smokes 1 pack about every 4-5 days and verbalizes the need to quit. Pt states that she only coughs once or twice when up movign around but once laying flat she coughs every few minutes. Pt denies reflux and has been on lisinopril for years without problems. Observed cough that sounded productive but patient didn't clear or spit phlegm out.  Refill and continue promethazine-dextromethorphan (PROMETHAZINE-DM) 6.25-15 MG/5ML syrup; Take 5 mLs by mouth 2 (two) times daily as needed for cough.   Continue all other maintenance medications.  Follow up plan: Return in about 4 weeks (around 05/13/2023) for DM follow up.   Relevant past medical, surgical, family, and social history reviewed and updated as indicated.  Allergies and medications reviewed and updated. Data reviewed: Chart in Epic.   Past Medical History:  Diagnosis Date   COVID-19 03/30/2020   Diabetes (HCC)    High cholesterol    History of endometrial ablation 03/31/2019   History of tubal ligation 03/31/2019   Hypertension     Past Surgical History:  Procedure Laterality Date   CESAREAN SECTION     x2   ENDOMETRIAL ABLATION     2006   FOOT SURGERY     GANGLION CYST EXCISION      Social History   Socioeconomic History   Marital status: Single  Spouse name: Not on file   Number of children: Not on file   Years of education: Not on file   Highest education level: Not on file  Occupational History   Occupation: CUSTOMER SERVICE    Employer: [employer]  Tobacco Use   Smoking status: Every Day    Current packs/day: 0.00    Average packs/day: 0.5 packs/day for 25.0 years (12.5 ttl pk-yrs)    Types: Cigarettes    Start date: 04/07/1995    Last attempt to quit: 04/06/2020    Years since quitting: 3.0   Smokeless tobacco: Never   Vaping Use   Vaping status: Never Used  Substance and Sexual Activity   Alcohol use: Yes    Alcohol/week: 3.0 standard drinks of alcohol    Types: 3 Standard drinks or equivalent per week    Comment: occ   Drug use: No   Sexual activity: Yes    Partners: Male    Birth control/protection: None  Other Topics Concern   Not on file  Social History Narrative   Not on file   Social Drivers of Health   Financial Resource Strain: Not on file  Food Insecurity: Not on file  Transportation Needs: Not on file  Physical Activity: Not on file  Stress: Not on file  Social Connections: Unknown (07/23/2021)   Received from [facility], [facility]   Social Network    Social Network: Not on file  Intimate Partner Violence: Unknown (06/20/2021)   Received from [facility], [facility]   HITS    Physically Hurt: Not on file    Insult or Talk Down To: Not on file    Threaten Physical Harm: Not on file    Scream or Curse: Not on file    Outpatient Encounter Medications as of 04/15/2023  Medication Sig   linaclotide (LINZESS) 72 MCG capsule Take 1 capsule (72 mcg total) by mouth daily before breakfast.   NON FORMULARY MORNING KICK   NON FORMULARY 30 Billion Cells. GUT STRIKE   NON FORMULARY [place] DERMAL REPAIR COMPLEX   pravastatin (PRAVACHOL) 80 MG tablet Take 1 tablet (80 mg total) by mouth daily.   Semaglutide,0.25 or 0.5MG /DOS, (OZEMPIC, 0.25 OR 0.5 MG/DOSE,) 2 MG/3ML SOPN Inject 0.25mg  weekly for 4 weeks then 0.5mg  weekly thereafter.   valACYclovir (VALTREX) 1000 MG tablet TAKE 2 TABS BY MOUTH EVERY 12 HOURS FOR 1 DAY   [DISCONTINUED] lisinopril (ZESTRIL) 10 MG tablet TAKE 1 TABLET (10 MG TOTAL) BY MOUTH DAILY. NEEDS APPOINTMENT FOR FURTHER REFILLS.   [DISCONTINUED] metFORMIN (GLUCOPHAGE) 1000 MG tablet TAKE 1 TABLET BY MOUTH TWICE A DAY WITH FOOD   [DISCONTINUED] promethazine-dextromethorphan (PROMETHAZINE-DM) 6.25-15 MG/5ML syrup Take 5 mLs by mouth 2 (two) times daily as  needed for cough.   [DISCONTINUED] traZODone (DESYREL) 100 MG tablet TAKE 1 TABLET BY MOUTH EVERY DAY AT BEDTIME AS NEEDED FOR SLEEP   lisinopril (ZESTRIL) 10 MG tablet Take 1 tablet (10 mg total) by mouth daily. NEEDS APPOINTMENT FOR FURTHER REFILLS.   metFORMIN (GLUCOPHAGE) 1000 MG tablet Take 1 tablet (1,000 mg total) by mouth 2 (two) times daily with a meal.   promethazine-dextromethorphan (PROMETHAZINE-DM) 6.25-15 MG/5ML syrup Take 5 mLs by mouth 2 (two) times daily as needed for cough.   traZODone (DESYREL) 100 MG tablet TAKE 1 TABLET BY MOUTH EVERY DAY AT BEDTIME AS NEEDED FOR SLEEP   [DISCONTINUED] clobetasol ointment (TEMOVATE) 0.05 % Apply 1 Application topically 2 (two) times daily.   [DISCONTINUED] fexofenadine (  ALLEGRA ALLERGY) 180 MG tablet Take 1 tablet (180 mg total) by mouth daily for 15 days.   [DISCONTINUED] predniSONE (DELTASONE) 20 MG tablet Take 3 tabs PO daily x 5 days.   No facility-administered encounter medications on file as of 04/15/2023.    Allergies  Allergen Reactions   Ciprofloxacin Rash   Sulfa Antibiotics Rash    Review of Systems  Objective:  BP 128/73 (BP Location: Left Arm, Cuff Size: Normal)   Pulse 89   Resp 20   Ht 5\' 10"  (1.778 m)   Wt 170 lb 1.9 oz (77.2 kg)   SpO2 100%   BMI 24.41 kg/m    Wt Readings from Last 3 Encounters:  04/15/23 170 lb 1.9 oz (77.2 kg)  12/10/22 168 lb 6.4 oz (76.4 kg)  11/09/22 169 lb 12 oz (77 kg)    Physical Exam  Results for orders placed or performed in visit on 04/15/23  POCT HgB A1C   Collection Time: 04/15/23  3:07 PM  Result Value Ref Range   Hemoglobin A1C 9.0 (A) 4.0 - 5.6 %   HbA1c POC (<> result, manual entry)     HbA1c, POC (prediabetic range)     HbA1c, POC (controlled diabetic range) 9.0 (A) 0.0 - 7.0 %       Pertinent labs & imaging results that were available during my care of the patient were reviewed by me and considered in my medical decision making.   Continue healthy lifestyle  choices, including diet (rich in fruits, vegetables, and lean proteins, and low in salt and simple carbohydrates) and exercise (at least 30 minutes of moderate physical activity daily).   The above assessment and management plan was discussed with the patient. The patient verbalized understanding of and has agreed to the management plan. Patient is aware to call the clinic if they develop any new symptoms or if symptoms persist or worsen. Patient is aware when to return to the clinic for a follow-up visit. Patient educated on when it is appropriate to go to the emergency department.   Maryelizabeth Kaufmann Student AGNP

## 2023-04-16 ENCOUNTER — Encounter: Payer: Self-pay | Admitting: Medical-Surgical

## 2023-04-16 LAB — CMP14+EGFR
ALT: 33 [IU]/L — ABNORMAL HIGH (ref 0–32)
AST: 19 [IU]/L (ref 0–40)
Albumin: 4.3 g/dL (ref 3.8–4.9)
Alkaline Phosphatase: 92 [IU]/L (ref 44–121)
BUN/Creatinine Ratio: 23 (ref 9–23)
BUN: 13 mg/dL (ref 6–24)
Bilirubin Total: 0.4 mg/dL (ref 0.0–1.2)
CO2: 22 mmol/L (ref 20–29)
Calcium: 10.2 mg/dL (ref 8.7–10.2)
Chloride: 98 mmol/L (ref 96–106)
Creatinine, Ser: 0.57 mg/dL (ref 0.57–1.00)
Globulin, Total: 2.8 g/dL (ref 1.5–4.5)
Glucose: 184 mg/dL — ABNORMAL HIGH (ref 70–99)
Potassium: 4.3 mmol/L (ref 3.5–5.2)
Sodium: 138 mmol/L (ref 134–144)
Total Protein: 7.1 g/dL (ref 6.0–8.5)
eGFR: 106 mL/min/{1.73_m2} (ref >59)

## 2023-04-16 LAB — LIPID PANEL
Chol/HDL Ratio: 4.8 {ratio} — ABNORMAL HIGH (ref 0.0–4.4)
Cholesterol, Total: 169 mg/dL (ref 100–199)
HDL: 35 mg/dL — ABNORMAL LOW (ref >39)
LDL Chol Calc (NIH): 75 mg/dL (ref 0–99)
Triglycerides: 370 mg/dL — ABNORMAL HIGH (ref 0–149)
VLDL Cholesterol Cal: 59 mg/dL — ABNORMAL HIGH (ref 5–40)

## 2023-04-25 ENCOUNTER — Ambulatory Visit
Admission: RE | Admit: 2023-04-25 | Discharge: 2023-04-25 | Disposition: A | Discharge status: Home / Self Care | Payer: 59 | Source: Ambulatory Visit | Attending: Family Medicine | Admitting: Family Medicine

## 2023-04-25 ENCOUNTER — Ambulatory Visit: Payer: 59

## 2023-04-25 VITALS — BP 145/80 | HR 77 | Temp 98.2°F | Resp 18

## 2023-04-25 DIAGNOSIS — R053 Chronic cough: Secondary | ICD-10-CM | POA: Diagnosis not present

## 2023-04-25 DIAGNOSIS — R059 Cough, unspecified: Secondary | ICD-10-CM

## 2023-04-25 MED ORDER — HYDROCOD POLI-CHLORPHE POLI ER 10-8 MG/5ML PO SUER: 5.0000 mL | Freq: Two times a day (BID) | ORAL | 0 refills | Status: DC | PRN

## 2023-04-25 MED ORDER — FEXOFENADINE HCL 180 MG PO TABS: 180.0000 mg | ORAL_TABLET | Freq: Every day | ORAL | 0 refills | Status: DC

## 2023-04-25 NOTE — Discharge Instructions (Signed)
 May continue the Mucinex  Add Allegra  once a day Take Tussionex at bedtime Stop Promethazine  DM Drink lots of fluids Congratulations on your efforts to quit smoking

## 2023-04-25 NOTE — ED Provider Notes (Signed)
 [facility]    CSN: [id number] Arrival date & time: 04/25/23  1330      History   Chief Complaint Chief Complaint  Patient presents with   Cough    Entered by patient    HPI Barbara Lloyd is a 58 y.o. female.   Patient is here for cough.  This is her third visit for cough since November.  She states her cough is never completely gone away.  She has seen her primary care doctor for the cough as well.  She is a smoker.  She started off with bronchitis but now she just has persistent cough at night.  Denies GERD symptoms.  Denies postnasal drip.  She is on lisinopril  but has been so for a long time.  Has very little coughing during the day.  At night has bad coughing spells.  She coughs and her chest hurts.  She is here because she is worried she has pneumonia    Past Medical History:  Diagnosis Date   COVID-19 03/30/2020   Diabetes (HCC)    High cholesterol    History of endometrial ablation 03/31/2019   History of tubal ligation 03/31/2019   Hypertension     Patient Active Problem List   Diagnosis Date Noted   Chronic cough 04/15/2023   Chronic constipation 11/09/2022   Psoriasis 01/25/2021   Cold sore 01/25/2021   Primary insomnia 01/25/2021   Hidradenitis suppurativa 11/09/2019   Adopted 03/31/2019   Hyperlipidemia associated with type 2 diabetes mellitus (HCC) 03/28/2016   Essential hypertension 08/16/2015   Dental caries 08/16/2015    Past Surgical History:  Procedure Laterality Date   CESAREAN SECTION     x2   ENDOMETRIAL ABLATION     2006   FOOT SURGERY     GANGLION CYST EXCISION      OB History   No obstetric history on file.      Home Medications    Prior to Admission medications   Medication Sig Start Date End Date Taking? Authorizing Provider  chlorpheniramine-HYDROcodone  (TUSSIONEX) 10-8 MG/5ML Take 5 mLs by mouth every 12 (twelve) hours as needed for cough. 04/25/23  Yes Maranda Jamee Jacob, MD  fexofenadine  (ALLEGRA ) 180 MG  tablet Take 1 tablet (180 mg total) by mouth daily. 04/25/23  Yes Maranda Jamee Jacob, MD  linaclotide  (LINZESS ) 72 MCG capsule Take 1 capsule (72 mcg total) by mouth daily before breakfast. 11/09/22  Yes Bevin, Erika S, DO  lisinopril  (ZESTRIL ) 10 MG tablet Take 1 tablet (10 mg total) by mouth daily. NEEDS APPOINTMENT FOR FURTHER REFILLS. 04/15/23  Yes Willo Mini, NP  metFORMIN  (GLUCOPHAGE ) 1000 MG tablet Take 1 tablet (1,000 mg total) by mouth 2 (two) times daily with a meal. 04/15/23  Yes Willo Mini, NP  NON FORMULARY MORNING KICK   Yes [provider]  NON FORMULARY 30 Billion Cells. GUT STRIKE   Yes [provider]  NON FORMULARY [place] DERMAL REPAIR COMPLEX   Yes [provider]  pravastatin  (PRAVACHOL ) 80 MG tablet Take 1 tablet (80 mg total) by mouth daily. 12/18/22  Yes Willo Mini, NP  promethazine -dextromethorphan (PROMETHAZINE -DM) 6.25-15 MG/5ML syrup Take 5 mLs by mouth 2 (two) times daily as needed for cough. 04/15/23  Yes Jessup, Mini, NP  Semaglutide ,0.25 or 0.5MG /DOS, (OZEMPIC , 0.25 OR 0.5 MG/DOSE,) 2 MG/3ML SOPN Inject 0.25mg  weekly for 4 weeks then 0.5mg  weekly thereafter. 04/15/23  Yes Jessup, Joy, NP  traZODone  (DESYREL ) 100 MG tablet TAKE 1 TABLET BY MOUTH EVERY  DAY AT BEDTIME AS NEEDED FOR SLEEP 04/15/23  Yes Willo Mini, NP  valACYclovir  (VALTREX ) 1000 MG tablet TAKE 2 TABS BY MOUTH EVERY 12 HOURS FOR 1 DAY 01/16/23  Yes Willo Mini, NP    Family History Family History  Adopted: Yes  Problem Relation Age of Onset   Healthy Mother    Healthy Father     Social History Social History   Tobacco Use   Smoking status: Former    Current packs/day: 0.00    Average packs/day: 0.5 packs/day for 25.0 years (12.5 ttl pk-yrs)    Types: Cigarettes    Start date: 04/07/1995    Quit date: 04/06/2020    Years since quitting: 3.0   Smokeless tobacco: Never  Vaping Use   Vaping status: Never Used  Substance Use Topics   Alcohol use: Yes     Alcohol/week: 3.0 standard drinks of alcohol    Types: 3 Standard drinks or equivalent per week    Comment: occ   Drug use: No     Allergies   Ciprofloxacin and Sulfa antibiotics   Review of Systems Review of Systems See HPI  Physical Exam Triage Vital Signs ED Triage Vitals  Encounter Vitals Group     BP 04/25/23 1342 (!) 145/80     Systolic BP Percentile --      Diastolic BP Percentile --      Pulse Rate 04/25/23 1342 77     Resp 04/25/23 1342 18     Temp 04/25/23 1342 98.2 F (36.8 C)     Temp Source 04/25/23 1342 Oral     SpO2 04/25/23 1342 98 %     Weight --      Height --      Head Circumference --      Peak Flow --      Pain Score 04/25/23 1344 0     Pain Loc --      Pain Education --      Exclude from Growth Chart --    No data found.  Updated Vital Signs BP (!) 145/80 (BP Location: Right Arm)   Pulse 77   Temp 98.2 F (36.8 C) (Oral)   Resp 18   SpO2 98%      Physical Exam Constitutional:      General: She is not in acute distress.    Appearance: She is well-developed.  HENT:     Head: Normocephalic and atraumatic.  Eyes:     Conjunctiva/sclera: Conjunctivae normal.     Pupils: Pupils are equal, round, and reactive to light.  Cardiovascular:     Rate and Rhythm: Normal rate and regular rhythm.     Heart sounds: Normal heart sounds.  Pulmonary:     Effort: Pulmonary effort is normal. No respiratory distress.     Breath sounds: Normal breath sounds.  Abdominal:     General: There is no distension.     Palpations: Abdomen is soft.  Musculoskeletal:        General: Normal range of motion.     Cervical back: Normal range of motion.  Skin:    General: Skin is warm and dry.  Neurological:     Mental Status: She is alert.      UC Treatments / Results  Labs (all labs ordered are listed, but only abnormal results are displayed) Labs Reviewed - No data to display  EKG   Radiology No results found.  Procedures Procedures (including  critical care time)  Medications  Ordered in UC Medications - No data to display  Initial Impression / Assessment and Plan / UC Course  I have reviewed the triage vital signs and the nursing notes.  Pertinent labs & imaging results that were available during my care of the patient were reviewed by me and considered in my medical decision making (see chart for details).     Patient is trying to quit smoking.  She is congratulated on this effort. We discussed that postnasal drip can contribute to her chronic cough.  MA to put her back on Allegra  since he said this helped her before I am going to and switch her cough medication She will follow-up with her PCP Final Clinical Impressions(s) / UC Diagnoses   Final diagnoses:  Chronic cough     Discharge Instructions      May continue the Mucinex  Add Allegra  once a day Take Tussionex at bedtime Stop Promethazine  DM Drink lots of fluids Congratulations on your efforts to quit smoking     ED Prescriptions     Medication Sig Dispense Auth. Provider   fexofenadine  (ALLEGRA ) 180 MG tablet Take 1 tablet (180 mg total) by mouth daily. 30 tablet Maranda Jamee Jacob, MD   chlorpheniramine-HYDROcodone  (TUSSIONEX) 10-8 MG/5ML Take 5 mLs by mouth every 12 (twelve) hours as needed for cough. 115 mL Maranda Jamee Jacob, MD      I have reviewed the PDMP during this encounter.   Maranda Jamee Jacob, MD 04/25/23 [phone]

## 2023-04-25 NOTE — ED Triage Notes (Signed)
 Patient c/o continuous cough, concerned for pneumonia.  Patient has had a cough since November.  Cough has returned and just lingering on.  Patient has taken Promethazine  cough syrup @ night to sleep.  Mucinex  and OTC daytime cough.

## 2023-05-20 ENCOUNTER — Encounter: Payer: Self-pay | Admitting: Medical-Surgical

## 2023-05-20 ENCOUNTER — Ambulatory Visit (INDEPENDENT_AMBULATORY_CARE_PROVIDER_SITE_OTHER): Admitting: Medical-Surgical

## 2023-05-20 DIAGNOSIS — Z7984 Long term (current) use of oral hypoglycemic drugs: Secondary | ICD-10-CM

## 2023-05-20 DIAGNOSIS — E119 Type 2 diabetes mellitus without complications: Secondary | ICD-10-CM

## 2023-05-20 NOTE — Progress Notes (Signed)
 Patient requested a training for Ozempic rx (self injection). Patient verbalized understanding throughout the training. Patient demonstrated self injection technique properly and is confident to complete self injections at home.

## 2023-05-20 NOTE — Progress Notes (Signed)
 Medical screening examination/treatment was performed by qualified clinical staff member and as supervising provider I was immediately available for consultation/collaboration. I have reviewed documentation and agree with assessment and plan.  Thayer Ohm, DNP, APRN, FNP-BC [facility]

## 2023-05-25 ENCOUNTER — Other Ambulatory Visit: Payer: Self-pay | Admitting: Medical-Surgical

## 2023-05-25 DIAGNOSIS — E119 Type 2 diabetes mellitus without complications: Secondary | ICD-10-CM

## 2023-06-14 ENCOUNTER — Other Ambulatory Visit: Payer: Self-pay | Admitting: Medical-Surgical

## 2023-06-14 DIAGNOSIS — F5101 Primary insomnia: Secondary | ICD-10-CM

## 2023-06-18 ENCOUNTER — Other Ambulatory Visit: Payer: Self-pay | Admitting: Medical-Surgical

## 2023-06-18 DIAGNOSIS — F5101 Primary insomnia: Secondary | ICD-10-CM

## 2023-06-24 ENCOUNTER — Ambulatory Visit: Admitting: Medical-Surgical

## 2023-06-26 ENCOUNTER — Ambulatory Visit
Admission: RE | Admit: 2023-06-26 | Discharge: 2023-06-26 | Disposition: A | Discharge status: Home / Self Care | Source: Ambulatory Visit | Attending: Family Medicine | Admitting: Family Medicine

## 2023-06-26 VITALS — BP 148/79 | HR 79 | Temp 98.2°F | Resp 18

## 2023-06-26 DIAGNOSIS — L03115 Cellulitis of right lower limb: Secondary | ICD-10-CM

## 2023-06-26 MED ORDER — DOXYCYCLINE HYCLATE 100 MG PO CAPS: 100.0000 mg | ORAL_CAPSULE | Freq: Two times a day (BID) | ORAL | 0 refills | Status: AC

## 2023-06-26 NOTE — Discharge Instructions (Addendum)
 Advised patient to take medication as directed with food to completion.  Encouraged increase daily water intake to 64 ounces per day while taking this medication.  Advised symptoms worsen and/or unresolved please follow-up with PCP or here for further evaluation.

## 2023-06-26 NOTE — ED Triage Notes (Signed)
 Patient presents to Urgent Care with complaints of bump on right shin since 1 week ago. Patient reports the area started hurting. She did admit to working out in the yard/woods.

## 2023-06-26 NOTE — ED Provider Notes (Signed)
 [facility]    CSN: [id number] Arrival date & time: 06/26/23  1851      History   Chief Complaint Chief Complaint  Patient presents with   Blister    HPI Barbara Lloyd is a 58 y.o. female.   HPI 58 year old female presents with bump/lesion of right shin for 1 week patient reports working in yard and woods prior to noticing bump/lesion.  PMH significant for HTN.  Past Medical History:  Diagnosis Date   COVID-19 03/30/2020   Diabetes (HCC)    High cholesterol    History of endometrial ablation 03/31/2019   History of tubal ligation 03/31/2019   Hypertension     Patient Active Problem List   Diagnosis Date Noted   Chronic cough 04/15/2023   Chronic constipation 11/09/2022   Psoriasis 01/25/2021   Cold sore 01/25/2021   Primary insomnia 01/25/2021   Hidradenitis suppurativa 11/09/2019   Adopted 03/31/2019   Hyperlipidemia associated with type 2 diabetes mellitus (HCC) 03/28/2016   Essential hypertension 08/16/2015   Dental caries 08/16/2015    Past Surgical History:  Procedure Laterality Date   CESAREAN SECTION     x2   ENDOMETRIAL ABLATION     2006   FOOT SURGERY     GANGLION CYST EXCISION      OB History   No obstetric history on file.      Home Medications    Prior to Admission medications   Medication Sig Start Date End Date Taking? Authorizing Provider  chlorpheniramine-HYDROcodone (TUSSIONEX) 10-8 MG/5ML Take 5 mLs by mouth every 12 (twelve) hours as needed for cough. 04/25/23  Yes Eustace Moore, MD  doxycycline (VIBRAMYCIN) 100 MG capsule Take 1 capsule (100 mg total) by mouth 2 (two) times daily for 7 days. 06/26/23 07/03/23 Yes Trevor Iha, FNP  fexofenadine (ALLEGRA) 180 MG tablet Take 1 tablet (180 mg total) by mouth daily. 04/25/23  Yes Eustace Moore, MD  linaclotide [facility]) 72 MCG capsule Take 1 capsule (72 mcg total) by mouth daily before breakfast. 11/09/22  Yes Tamera Punt, Erika S, DO  lisinopril (ZESTRIL) 10 MG tablet  Take 1 tablet (10 mg total) by mouth daily. NEEDS APPOINTMENT FOR FURTHER REFILLS. 04/15/23  Yes [name], NP  metFORMIN (GLUCOPHAGE) 1000 MG tablet Take 1 tablet (1,000 mg total) by mouth 2 (two) times daily with a meal. 04/15/23  Yes [name], NP  NON FORMULARY MORNING KICK   Yes [provider]  NON FORMULARY 30 Billion Cells. GUT STRIKE   Yes [provider]  NON FORMULARY [place] DERMAL REPAIR COMPLEX   Yes [provider]  pravastatin (PRAVACHOL) 80 MG tablet Take 1 tablet (80 mg total) by mouth daily. 12/18/22  Yes [name], NP  promethazine-dextromethorphan (PROMETHAZINE-DM) 6.25-15 MG/5ML syrup Take 5 mLs by mouth 2 (two) times daily as needed for cough. 04/15/23  Yes Jessup, Joy, NP  Semaglutide,0.25 or 0.5MG /DOS, (OZEMPIC, 0.25 OR 0.5 MG/DOSE,) 2 MG/3ML SOPN INJECT 0.25MG  WEEKLY FOR 4 WEEKS THEN 0.5MG  WEEKLY THEREAFTER. 05/27/23  Yes Jessup, Joy, NP  traZODone (DESYREL) 100 MG tablet Take 1 tablet (100 mg total) by mouth at bedtime as needed. NEEDS APPOINTMENT FOR FURTHER REFILLS. 06/17/23  Yes [name], NP  valACYclovir (VALTREX) 1000 MG tablet TAKE 2 TABS BY MOUTH EVERY 12 HOURS FOR 1 DAY 01/16/23  Yes [name], NP    Family History Family History  Adopted: Yes  Problem Relation Age of Onset   Healthy Mother    Healthy Father  Social History Social History   Tobacco Use   Smoking status: Former    Current packs/day: 0.00    Average packs/day: 0.5 packs/day for 25.0 years (12.5 ttl pk-yrs)    Types: Cigarettes    Start date: 04/07/1995    Quit date: 04/06/2020    Years since quitting: 3.2   Smokeless tobacco: Never  Vaping Use   Vaping status: Never Used  Substance Use Topics   Alcohol use: Yes    Alcohol/week: 3.0 standard drinks of alcohol    Types: 3 Standard drinks or equivalent per week    Comment: occ   Drug use: No     Allergies   Ciprofloxacin and Sulfa antibiotics   Review of Systems Review of  Systems   Physical Exam Triage Vital Signs ED Triage Vitals  Encounter Vitals Group     BP 06/26/23 1859 (!) 148/79     Systolic BP Percentile --      Diastolic BP Percentile --      Pulse Rate 06/26/23 1859 79     Resp 06/26/23 1859 18     Temp 06/26/23 1859 98.2 F (36.8 C)     Temp Source 06/26/23 1859 Oral     SpO2 06/26/23 1859 98 %     Weight --      Height --      Head Circumference --      Peak Flow --      Pain Score 06/26/23 1857 3     Pain Loc --      Pain Education --      Exclude from Growth Chart --    No data found.  Updated Vital Signs BP (!) 148/79 (BP Location: Right Arm)   Pulse 79   Temp 98.2 F (36.8 C) (Oral)   Resp 18   SpO2 98%    Physical Exam Vitals and nursing note reviewed.  Constitutional:      General: She is not in acute distress.    Appearance: Normal appearance. She is normal weight. She is not ill-appearing.  HENT:     Head: Normocephalic and atraumatic.     Mouth/Throat:     Mouth: Mucous membranes are moist.     Pharynx: Oropharynx is clear.  Eyes:     Extraocular Movements: Extraocular movements intact.     Conjunctiva/sclera: Conjunctivae normal.     Pupils: Pupils are equal, round, and reactive to light.  Cardiovascular:     Rate and Rhythm: Normal rate and regular rhythm.     Pulses: Normal pulses.     Heart sounds: Normal heart sounds.  Pulmonary:     Effort: Pulmonary effort is normal.     Breath sounds: Normal breath sounds. No wheezing, rhonchi or rales.  Musculoskeletal:        General: Normal range of motion.     Cervical back: Normal range of motion and neck supple.  Skin:    General: Skin is warm and dry.     Comments: Right lower leg (anterior inferior aspect of shin):~0.5 cm circular shaped erythematous papule, with mucopurulent center-please see image below  Neurological:     General: No focal deficit present.     Mental Status: She is alert and oriented to person, place, and time.  Psychiatric:         Mood and Affect: Mood normal.        Behavior: Behavior normal.        Thought Content: Thought content normal.  UC Treatments / Results  Labs (all labs ordered are listed, but only abnormal results are displayed) Labs Reviewed - No data to display  EKG   Radiology No results found.  Procedures Procedures (including critical care time)  Medications Ordered in UC Medications - No data to display  Initial Impression / Assessment and Plan / UC Course  I have reviewed the triage vital signs and the nursing notes.  Pertinent labs & imaging results that were available during my care of the patient were reviewed by me and considered in my medical decision making (see chart for details).     MDM: 1.  Cellulitis of leg, right-Rx'd doxycycline 100 mg capsule: Take 1 capsule twice daily x 7 days. Advised patient to take medication as directed with food to completion.  Encouraged increase daily water intake to 64 ounces per day while taking this medication.  Advised symptoms worsen and/or unresolved please follow-up with PCP or here for further evaluation.  Patient discharged home, hemodynamically stable. Final Clinical Impressions(s) / UC Diagnoses   Final diagnoses:  Cellulitis of leg, right     Discharge Instructions      Advised patient to take medication as directed with food to completion.  Encouraged increase daily water intake to 64 ounces per day while taking this medication.  Advised symptoms worsen and/or unresolved please follow-up with PCP or here for further evaluation.     ED Prescriptions     Medication Sig Dispense Auth. Provider   doxycycline (VIBRAMYCIN) 100 MG capsule Take 1 capsule (100 mg total) by mouth 2 (two) times daily for 7 days. 14 capsule Trevor Iha, FNP      PDMP not reviewed this encounter.   Trevor Iha, FNP 06/26/23 1932

## 2023-07-05 ENCOUNTER — Other Ambulatory Visit: Payer: Self-pay | Admitting: Medical-Surgical

## 2023-07-05 DIAGNOSIS — E119 Type 2 diabetes mellitus without complications: Secondary | ICD-10-CM

## 2023-07-19 ENCOUNTER — Other Ambulatory Visit: Payer: Self-pay | Admitting: Medical-Surgical

## 2023-07-19 DIAGNOSIS — F5101 Primary insomnia: Secondary | ICD-10-CM

## 2023-07-22 ENCOUNTER — Ambulatory Visit (INDEPENDENT_AMBULATORY_CARE_PROVIDER_SITE_OTHER): Admitting: Medical-Surgical

## 2023-07-22 ENCOUNTER — Encounter: Payer: Self-pay | Admitting: Medical-Surgical

## 2023-07-22 VITALS — BP 108/68 | HR 72 | Resp 20 | Ht 70.0 in | Wt 158.3 lb

## 2023-07-22 DIAGNOSIS — F5101 Primary insomnia: Secondary | ICD-10-CM

## 2023-07-22 DIAGNOSIS — Z7984 Long term (current) use of oral hypoglycemic drugs: Secondary | ICD-10-CM | POA: Diagnosis not present

## 2023-07-22 DIAGNOSIS — I10 Essential (primary) hypertension: Secondary | ICD-10-CM

## 2023-07-22 DIAGNOSIS — E119 Type 2 diabetes mellitus without complications: Secondary | ICD-10-CM | POA: Diagnosis not present

## 2023-07-22 LAB — POCT GLYCOSYLATED HEMOGLOBIN (HGB A1C)
HbA1c, POC (controlled diabetic range): 7.3 % — AB (ref 0.0–7.0)
Hemoglobin A1C: 7.3 % — AB (ref 4.0–5.6)

## 2023-07-22 MED ORDER — TRAZODONE HCL 100 MG PO TABS: 100.0000 mg | ORAL_TABLET | Freq: Every evening | ORAL | 3 refills | Status: AC | PRN

## 2023-07-22 MED ORDER — FEXOFENADINE HCL 180 MG PO TABS: 180.0000 mg | ORAL_TABLET | Freq: Every day | ORAL | 3 refills | Status: AC

## 2023-07-22 NOTE — Progress Notes (Signed)
        Established patient visit  History, exam, impression, and plan:  1. Primary insomnia Pleasant 58 year old female presenting today with a history of primary insomnia.  She is taking trazodone  100 mg nightly, tolerating well without side effects.  Feels that medication is working well for her and she is able to get quality sleep and good quantities.  Continue trazodone  as prescribed. - traZODone  (DESYREL ) 100 MG tablet; Take 1 tablet (100 mg total) by mouth at bedtime as needed.  Dispense: 90 tablet; Refill: 3  2. Type 2 diabetes mellitus without complication, without long-term current use of insulin (HCC) (Primary) History of type 2 diabetes currently treated with metformin  1 g twice daily.  She was started on Ozempic  at her last visit however admits that she did not start this until March.  After giving herself 3 injections, she realized that she had not remove the From the syringe and the medication had been going in the All along.  She has done 2 weeks of the 0.25 mg Ozempic  now that she has figured out the correct administration technique.  Checking sugars and reveals her fasting reading this morning was 118.  Has made multiple dietary modifications and lost approximately 12 pounds since January.  Previous hemoglobin A1c at 9.0%, recheck today at 7.3% showing excellent improvement.  With more time on Ozempic , suspect that she will do very well.  For now continue Ozempic  0.25 mg weekly x 2 more weeks then increase to 0.5 mg weekly.  Continue metformin  1 g twice daily as prescribed. - POCT HgB A1C  3. Essential hypertension Currently taking lisinopril  10 mg daily, tolerating well without side effects.  Blood pressure is at goal today.  No concerning symptoms.  Cardiopulmonary exam is normal.  Continue lisinopril  as prescribed.   Procedures performed this visit: None.  Return in about 3 months (around 10/22/2023) for DM follow up.  __________________________________ Maryl Snook, DNP,  APRN, FNP-BC Primary Care and Sports Medicine [facility] [place]

## 2023-08-12 ENCOUNTER — Other Ambulatory Visit: Payer: Self-pay | Admitting: Medical-Surgical

## 2023-08-12 DIAGNOSIS — E119 Type 2 diabetes mellitus without complications: Secondary | ICD-10-CM

## 2023-09-13 ENCOUNTER — Other Ambulatory Visit: Payer: Self-pay | Admitting: Medical-Surgical

## 2023-09-13 DIAGNOSIS — E119 Type 2 diabetes mellitus without complications: Secondary | ICD-10-CM

## 2023-10-08 ENCOUNTER — Other Ambulatory Visit: Payer: Self-pay | Admitting: Medical-Surgical

## 2023-10-08 DIAGNOSIS — E119 Type 2 diabetes mellitus without complications: Secondary | ICD-10-CM

## 2023-10-08 DIAGNOSIS — I10 Essential (primary) hypertension: Secondary | ICD-10-CM

## 2023-10-21 ENCOUNTER — Ambulatory Visit (INDEPENDENT_AMBULATORY_CARE_PROVIDER_SITE_OTHER): Admitting: Medical-Surgical

## 2023-10-21 ENCOUNTER — Encounter: Payer: Self-pay | Admitting: Medical-Surgical

## 2023-10-21 VITALS — BP 119/73 | HR 79 | Resp 20 | Ht 70.0 in | Wt 147.0 lb

## 2023-10-21 DIAGNOSIS — I10 Essential (primary) hypertension: Secondary | ICD-10-CM

## 2023-10-21 DIAGNOSIS — E1169 Type 2 diabetes mellitus with other specified complication: Secondary | ICD-10-CM | POA: Diagnosis not present

## 2023-10-21 DIAGNOSIS — E119 Type 2 diabetes mellitus without complications: Secondary | ICD-10-CM | POA: Diagnosis not present

## 2023-10-21 DIAGNOSIS — Z7984 Long term (current) use of oral hypoglycemic drugs: Secondary | ICD-10-CM

## 2023-10-21 DIAGNOSIS — E785 Hyperlipidemia, unspecified: Secondary | ICD-10-CM | POA: Diagnosis not present

## 2023-10-21 LAB — POCT GLYCOSYLATED HEMOGLOBIN (HGB A1C)
HbA1c, POC (controlled diabetic range): 6 % (ref 0.0–7.0)
Hemoglobin A1C: 6 % — AB (ref 4.0–5.6)

## 2023-10-21 LAB — POCT UA - MICROALBUMIN
Albumin/Creatinine Ratio, Urine, POC: <30
Creatinine, POC: 200 mg/dL
Microalbumin Ur, POC: 30 mg/L

## 2023-10-21 MED ORDER — OZEMPIC (0.25 OR 0.5 MG/DOSE) 2 MG/3ML ~~LOC~~ SOPN: 0.5000 mg | PEN_INJECTOR | SUBCUTANEOUS | 1 refills | Status: AC

## 2023-10-21 MED ORDER — PRAVASTATIN SODIUM 80 MG PO TABS: 80.0000 mg | ORAL_TABLET | Freq: Every day | ORAL | 3 refills | Status: AC

## 2023-10-21 MED ORDER — LISINOPRIL 10 MG PO TABS: 10.0000 mg | ORAL_TABLET | Freq: Every day | ORAL | 3 refills | Status: AC

## 2023-10-21 NOTE — Progress Notes (Signed)
        Established patient visit   History of Present Illness   Discussed the use of AI scribe software for clinical note transcription with the patient, who gave verbal consent to proceed.  History of Present Illness   Barbara Lloyd is a 58 year old female with type 2 diabetes who presents for a follow-up visit.  She manages her type 2 diabetes with metformin  1000mg  daily and Ozempic  0.5mg  weekly, achieving blood glucose levels between 90 and 120 mg/dL. Her hemoglobin A1c has improved from 9.3% to 7.3% over three months. She attributes these improvements to dietary changes, including increased water intake, portion control, and reduced nighttime eating. She incorporates more vegetables and uses the 'deck of card' portion size method for meals.  She takes pravastatin  80 mg for cholesterol and blood pressure management. She recently added fish oil to improve her HDL and reduce her LDL levels.  She has reduced soda intake to one per day and uses olive oil instead of butter for cooking. She diligently monitors her dietary intake, including sugar, salt, carbs, and calories. She reports no fever or significant weight gain.      Physical Exam   Physical Exam Vitals reviewed.  Constitutional:      General: She is not in acute distress.    Appearance: Normal appearance.  HENT:     Head: Normocephalic and atraumatic.  Cardiovascular:     Rate and Rhythm: Normal rate and regular rhythm.     Pulses: Normal pulses.     Heart sounds: Normal heart sounds. No murmur heard.    No friction rub. No gallop.  Pulmonary:     Effort: Pulmonary effort is normal. No respiratory distress.     Breath sounds: Normal breath sounds. No wheezing.  Skin:    General: Skin is warm and dry.  Neurological:     Mental Status: She is alert and oriented to person, place, and time.  Psychiatric:        Mood and Affect: Mood normal.        Behavior: Behavior normal.        Thought Content: Thought content  normal.        Judgment: Judgment normal.     Assessment & Plan   Assessment and Plan    Type 2 diabetes mellitus Significant improvement in glycemic control with lifestyle changes. Hemoglobin A1c decreased from 9.3% to 6.0%. - A1c 6.0% today. - Discontinue metformin . - Continue Ozempic  0.5 mg. - Monitor blood glucose levels, especially fasting and postprandial. - Provide Freestyle Libre 3+ sensor sample for 15 days of continuous glucose monitoring. - Order blood work to assess current status. - Follow up in 6 months unless earlier follow-up is needed.  Hyperlipidemia Previous elevation in LDL and low HDL. Dietary changes implemented to improve lipid profile. - Order blood work to assess lipid profile. - Continue pravastatin  80 mg. - Review blood work results and adjust pravastatin  dosage if necessary.  Hypertension Currently managed with lisinopril . - Continue lisinopril  as prescribed.    Follow up   Return in about 6 months (around 04/22/2024) for DM follow up.  __________________________________ Zada FREDRIK Palin, DNP, APRN, FNP-BC Primary Care and Sports Medicine [facility] [place]

## 2023-10-22 ENCOUNTER — Ambulatory Visit: Payer: Self-pay | Admitting: Medical-Surgical

## 2023-10-22 LAB — CMP14+EGFR
ALT: 14 IU/L (ref 0–32)
AST: 15 IU/L (ref 0–40)
Albumin: 4.7 g/dL (ref 3.8–4.9)
Alkaline Phosphatase: 79 IU/L (ref 44–121)
BUN/Creatinine Ratio: 24 — ABNORMAL HIGH (ref 9–23)
BUN: 15 mg/dL (ref 6–24)
Bilirubin Total: 0.6 mg/dL (ref 0.0–1.2)
CO2: 21 mmol/L (ref 20–29)
Calcium: 9.7 mg/dL (ref 8.7–10.2)
Chloride: 101 mmol/L (ref 96–106)
Creatinine, Ser: 0.62 mg/dL (ref 0.57–1.00)
Globulin, Total: 2.7 g/dL (ref 1.5–4.5)
Glucose: 97 mg/dL (ref 70–99)
Potassium: 4.5 mmol/L (ref 3.5–5.2)
Sodium: 139 mmol/L (ref 134–144)
Total Protein: 7.4 g/dL (ref 6.0–8.5)
eGFR: 103 mL/min/1.73 (ref >59)

## 2023-10-22 LAB — CBC WITH DIFFERENTIAL/PLATELET
Basophils Absolute: 0.1 x10E3/uL (ref 0.0–0.2)
Basos: 1 %
EOS (ABSOLUTE): 0.1 x10E3/uL (ref 0.0–0.4)
Eos: 2 %
Hematocrit: 44.2 % (ref 34.0–46.6)
Hemoglobin: 14.1 g/dL (ref 11.1–15.9)
Immature Grans (Abs): 0 x10E3/uL (ref 0.0–0.1)
Immature Granulocytes: 0 %
Lymphocytes Absolute: 2.4 x10E3/uL (ref 0.7–3.1)
Lymphs: 29 %
MCH: 29.4 pg (ref 26.6–33.0)
MCHC: 31.9 g/dL (ref 31.5–35.7)
MCV: 92 fL (ref 79–97)
Monocytes Absolute: 0.5 x10E3/uL (ref 0.1–0.9)
Monocytes: 6 %
Neutrophils Absolute: 5.3 x10E3/uL (ref 1.4–7.0)
Neutrophils: 62 %
Platelets: 331 x10E3/uL (ref 150–450)
RBC: 4.79 x10E6/uL (ref 3.77–5.28)
RDW: 13.1 % (ref 11.7–15.4)
WBC: 8.4 x10E3/uL (ref 3.4–10.8)

## 2023-10-22 LAB — LIPID PANEL
Chol/HDL Ratio: 3.2 ratio (ref 0.0–4.4)
Cholesterol, Total: 133 mg/dL (ref 100–199)
HDL: 41 mg/dL (ref >39)
LDL Chol Calc (NIH): 70 mg/dL (ref 0–99)
Triglycerides: 120 mg/dL (ref 0–149)
VLDL Cholesterol Cal: 22 mg/dL (ref 5–40)

## 2023-10-28 ENCOUNTER — Other Ambulatory Visit: Payer: Self-pay

## 2023-10-28 MED ORDER — FREESTYLE LIBRE 3 PLUS SENSOR MISC: 0 refills | Status: AC

## 2023-10-28 NOTE — Telephone Encounter (Signed)
 Patient would like for you to send this order over to the pharmacy and then she will deal with Insurance from there.

## 2023-11-19 ENCOUNTER — Other Ambulatory Visit: Payer: Self-pay

## 2023-11-19 DIAGNOSIS — K5909 Other constipation: Secondary | ICD-10-CM

## 2023-11-19 MED ORDER — LINACLOTIDE 72 MCG PO CAPS: 72.0000 ug | ORAL_CAPSULE | Freq: Every day | ORAL | 3 refills | Status: AC

## 2024-04-20 ENCOUNTER — Ambulatory Visit: Admitting: Medical-Surgical

## 2024-05-25 ENCOUNTER — Ambulatory Visit: Admitting: Medical-Surgical
